# Patient Record
Sex: Female | Born: 1935 | Race: White | Hispanic: No | Marital: Married | Smoking: Never smoker
Health system: Southern US, Community
[De-identification: ages and names within clinical notes are randomized; demographics above are authoritative.]

## PROBLEM LIST (undated history)

## (undated) DIAGNOSIS — E039 Hypothyroidism, unspecified: Secondary | ICD-10-CM

## (undated) DIAGNOSIS — F419 Anxiety disorder, unspecified: Secondary | ICD-10-CM

## (undated) DIAGNOSIS — M1990 Unspecified osteoarthritis, unspecified site: Secondary | ICD-10-CM

## (undated) DIAGNOSIS — R0989 Other specified symptoms and signs involving the circulatory and respiratory systems: Secondary | ICD-10-CM

## (undated) DIAGNOSIS — C801 Malignant (primary) neoplasm, unspecified: Secondary | ICD-10-CM

## (undated) DIAGNOSIS — I10 Essential (primary) hypertension: Secondary | ICD-10-CM

## (undated) DIAGNOSIS — S83289A Other tear of lateral meniscus, current injury, unspecified knee, initial encounter: Secondary | ICD-10-CM

## (undated) HISTORY — PX: OTHER SURGICAL HISTORY: SHX169

## (undated) HISTORY — PX: ABDOMINAL HYSTERECTOMY: SHX81

---

## 2008-10-04 HISTORY — PX: BACK SURGERY: SHX140

## 2012-12-28 ENCOUNTER — Ambulatory Visit: Payer: Medicare Other | Attending: Orthopedic Surgery | Admitting: Physical Therapy

## 2012-12-28 DIAGNOSIS — M25659 Stiffness of unspecified hip, not elsewhere classified: Secondary | ICD-10-CM | POA: Insufficient documentation

## 2012-12-28 DIAGNOSIS — M25559 Pain in unspecified hip: Secondary | ICD-10-CM | POA: Insufficient documentation

## 2012-12-28 DIAGNOSIS — R5381 Other malaise: Secondary | ICD-10-CM | POA: Insufficient documentation

## 2012-12-28 DIAGNOSIS — IMO0001 Reserved for inherently not codable concepts without codable children: Secondary | ICD-10-CM | POA: Insufficient documentation

## 2013-01-01 ENCOUNTER — Ambulatory Visit: Payer: Medicare Other | Admitting: *Deleted

## 2013-01-03 ENCOUNTER — Ambulatory Visit: Payer: Medicare Other | Attending: Orthopedic Surgery | Admitting: Physical Therapy

## 2013-01-03 DIAGNOSIS — R5381 Other malaise: Secondary | ICD-10-CM | POA: Insufficient documentation

## 2013-01-03 DIAGNOSIS — IMO0001 Reserved for inherently not codable concepts without codable children: Secondary | ICD-10-CM | POA: Insufficient documentation

## 2013-01-03 DIAGNOSIS — M25659 Stiffness of unspecified hip, not elsewhere classified: Secondary | ICD-10-CM | POA: Insufficient documentation

## 2013-01-03 DIAGNOSIS — M25559 Pain in unspecified hip: Secondary | ICD-10-CM | POA: Insufficient documentation

## 2013-01-08 ENCOUNTER — Ambulatory Visit: Payer: Medicare Other | Admitting: *Deleted

## 2013-01-10 ENCOUNTER — Ambulatory Visit: Payer: Medicare Other | Admitting: *Deleted

## 2013-01-15 ENCOUNTER — Ambulatory Visit: Payer: Medicare Other | Admitting: Physical Therapy

## 2013-01-17 ENCOUNTER — Ambulatory Visit: Payer: Medicare Other | Admitting: Physical Therapy

## 2013-01-22 ENCOUNTER — Ambulatory Visit: Payer: Medicare Other | Admitting: Physical Therapy

## 2013-01-24 ENCOUNTER — Ambulatory Visit: Payer: Medicare Other | Admitting: Physical Therapy

## 2015-01-16 ENCOUNTER — Ambulatory Visit (INDEPENDENT_AMBULATORY_CARE_PROVIDER_SITE_OTHER): Payer: Medicare Other

## 2015-01-16 ENCOUNTER — Other Ambulatory Visit: Payer: Self-pay | Admitting: Orthopedic Surgery

## 2015-01-16 DIAGNOSIS — M25552 Pain in left hip: Secondary | ICD-10-CM

## 2015-01-16 DIAGNOSIS — R52 Pain, unspecified: Secondary | ICD-10-CM

## 2015-01-16 DIAGNOSIS — M25561 Pain in right knee: Secondary | ICD-10-CM | POA: Diagnosis not present

## 2015-02-11 ENCOUNTER — Ambulatory Visit: Payer: Self-pay | Admitting: Orthopedic Surgery

## 2015-02-11 NOTE — Progress Notes (Signed)
Preoperative surgical orders have been place into the Epic hospital system for Sharon Gordon on 02/11/2015, 11:28 AM  by Mickel Crow for surgery on 03-12-2015.  Preop Knee Scope orders including IV Tylenol and IV Decadron as long as there are no contraindications to the above medications. Arlee Muslim, PA-C

## 2015-03-10 ENCOUNTER — Encounter (HOSPITAL_COMMUNITY): Payer: Self-pay

## 2015-03-10 ENCOUNTER — Encounter (HOSPITAL_COMMUNITY)
Admission: RE | Admit: 2015-03-10 | Discharge: 2015-03-10 | Disposition: A | Discharge status: Home / Self Care | Payer: Medicare Other | Source: Ambulatory Visit | Attending: Orthopedic Surgery | Admitting: Orthopedic Surgery

## 2015-03-10 DIAGNOSIS — I447 Left bundle-branch block, unspecified: Secondary | ICD-10-CM | POA: Diagnosis not present

## 2015-03-10 DIAGNOSIS — M23261 Derangement of other lateral meniscus due to old tear or injury, right knee: Secondary | ICD-10-CM | POA: Diagnosis present

## 2015-03-10 DIAGNOSIS — M1990 Unspecified osteoarthritis, unspecified site: Secondary | ICD-10-CM | POA: Diagnosis not present

## 2015-03-10 DIAGNOSIS — M2241 Chondromalacia patellae, right knee: Secondary | ICD-10-CM | POA: Diagnosis not present

## 2015-03-10 DIAGNOSIS — Z90710 Acquired absence of both cervix and uterus: Secondary | ICD-10-CM | POA: Diagnosis not present

## 2015-03-10 DIAGNOSIS — E039 Hypothyroidism, unspecified: Secondary | ICD-10-CM | POA: Diagnosis not present

## 2015-03-10 DIAGNOSIS — I10 Essential (primary) hypertension: Secondary | ICD-10-CM | POA: Diagnosis not present

## 2015-03-10 DIAGNOSIS — M25561 Pain in right knee: Secondary | ICD-10-CM | POA: Diagnosis not present

## 2015-03-10 HISTORY — DX: Other tear of lateral meniscus, current injury, unspecified knee, initial encounter: S83.289A

## 2015-03-10 HISTORY — DX: Hypothyroidism, unspecified: E03.9

## 2015-03-10 HISTORY — DX: Essential (primary) hypertension: I10

## 2015-03-10 HISTORY — DX: Unspecified osteoarthritis, unspecified site: M19.90

## 2015-03-10 HISTORY — DX: Other specified symptoms and signs involving the circulatory and respiratory systems: R09.89

## 2015-03-10 LAB — BASIC METABOLIC PANEL
Anion gap: 8 (ref 5–15)
BUN: 37 mg/dL — ABNORMAL HIGH (ref 6–20)
CALCIUM: 9.3 mg/dL (ref 8.9–10.3)
CO2: 28 mmol/L (ref 22–32)
Chloride: 104 mmol/L (ref 101–111)
Creatinine, Ser: 1.31 mg/dL — ABNORMAL HIGH (ref 0.44–1.00)
GFR calc non Af Amer: 36 mL/min — ABNORMAL LOW (ref >60)
GFR, EST AFRICAN AMERICAN: 42 mL/min — AB (ref >60)
GLUCOSE: 88 mg/dL (ref 65–99)
POTASSIUM: 4.2 mmol/L (ref 3.5–5.1)
SODIUM: 140 mmol/L (ref 135–145)

## 2015-03-10 LAB — CBC
HCT: 37.1 % (ref 36.0–46.0)
Hemoglobin: 12 g/dL (ref 12.0–15.0)
MCH: 32.3 pg (ref 26.0–34.0)
MCHC: 32.3 g/dL (ref 30.0–36.0)
MCV: 99.7 fL (ref 78.0–100.0)
Platelets: 340 10*3/uL (ref 150–400)
RBC: 3.72 MIL/uL — AB (ref 3.87–5.11)
RDW: 14.3 % (ref 11.5–15.5)
WBC: 6.2 10*3/uL (ref 4.0–10.5)

## 2015-03-10 NOTE — Progress Notes (Signed)
Patient states has already stopped plavix per surgeon instructions

## 2015-03-10 NOTE — Patient Instructions (Addendum)
Sharon Gordon  03/10/2015   Your procedure is scheduled on: Wednesday 03/12/15  Report to [facility] Main  Entrance and follow signs to               [name] at 1:30 PM.  Call this number if you have problems the morning of surgery [phone]   Remember: ONLY 1 PERSON MAY GO WITH YOU TO SHORT STAY TO GET  READY MORNING OF [place].  Do not eat food :After Midnight. Clear liquids from midnight until 09:35 am on 03/12/15 then nothing.      Take these medicines the morning of surgery with A SIP OF WATER: cymbalta, gabapentin, levothyroxine                                You may not have any metal on your body including hair pins and              piercings  Do not wear jewelry, make-up, lotions, powders or perfumes, deodorant             Do not wear nail polish.  Do not shave  48 hours prior to surgery.              Men may shave face and neck.  Do not bring valuables to the hospital. [place].  Contacts, dentures or bridgework may not be worn into surgery.   Patients discharged the day of surgery will not be allowed to drive home.  Name and phone number of your driver: Daughter Norvel Richards cell [phone]   _____________________________________________________________________           [facility] - Preparing for Surgery Before surgery, you can play an important role.  Because skin is not sterile, your skin needs to be as free of germs as possible.  You can reduce the number of germs on your skin by washing with CHG (chlorahexidine gluconate) soap before surgery.  CHG is an antiseptic cleaner which kills germs and bonds with the skin to continue killing germs even after washing. Please DO NOT use if you have an allergy to CHG or antibacterial soaps.  If your skin becomes reddened/irritated stop using the CHG and inform your nurse when you arrive at Short Stay. Do not shave (including legs and  underarms) for at least 48 hours prior to the first CHG shower.  You may shave your face/neck. Please follow these instructions carefully:  1.  Shower with CHG Soap the night before surgery and the  morning of Surgery.  2.  If you choose to wash your hair, wash your hair first as usual with your  normal  shampoo.  3.  After you shampoo, rinse your hair and body thoroughly to remove the  shampoo.                            4.  Use CHG as you would any other liquid soap.  You can apply chg directly  to the skin and wash                       Gently with a scrungie or clean washcloth.  5.  Apply the CHG Soap to your body ONLY FROM THE NECK DOWN.   Do not use on face/ open                           Wound or open sores. Avoid contact with eyes, ears mouth and genitals (private parts).                       Wash face,  Genitals (private parts) with your normal soap.             6.  Wash thoroughly, paying special attention to the area where your surgery  will be performed.  7.  Thoroughly rinse your body with warm water from the neck down.  8.  DO NOT shower/wash with your normal soap after using and rinsing off  the CHG Soap.                9.  Pat yourself dry with a clean towel.            10.  Wear clean pajamas.            11.  Place clean sheets on your bed the night of your first shower and do not  sleep with pets. Day of Surgery : Do not apply any lotions/deodorants the morning of surgery.  Please wear clean clothes to the hospital/surgery center.  FAILURE TO FOLLOW THESE INSTRUCTIONS MAY RESULT IN THE CANCELLATION OF YOUR SURGERY PATIENT SIGNATURE_________________________________  NURSE SIGNATURE__________________________________  ________________________________________________________________________    CLEAR LIQUID DIET   Foods Allowed                                                                     Foods Excluded  Coffee and tea, regular and decaf                              liquids that you cannot  Plain Jell-O in any flavor                                             see through such as: Fruit ices (not with fruit pulp)                                     milk, soups, orange juice  Iced Popsicles                                    All solid food Carbonated beverages, regular and diet                                    Cranberry, grape and apple juices Sports drinks like Gatorade Lightly seasoned clear broth or consume(fat free) Sugar, honey syrup  Sample Menu Breakfast  Lunch                                     Supper Cranberry juice                    Beef broth                            Chicken broth Jell-O                                     Grape juice                           Apple juice Coffee or tea                        Jell-O                                      Popsicle                                                Coffee or tea                        Coffee or tea  _____________________________________________________________________    Incentive Spirometer  An incentive spirometer is a tool that can help keep your lungs clear and active. This tool measures how well you are filling your lungs with each breath. Taking long deep breaths may help reverse or decrease the chance of developing breathing (pulmonary) problems (especially infection) following:  A long period of time when you are unable to move or be active. BEFORE THE PROCEDURE   If the spirometer includes an indicator to show your best effort, your nurse or respiratory therapist will set it to a desired goal.  If possible, sit up straight or lean slightly forward. Try not to slouch.  Hold the incentive spirometer in an upright position. INSTRUCTIONS FOR USE  1. Sit on the edge of your bed if possible, or sit up as far as you can in bed or on a chair. 2. Hold the incentive spirometer in an upright position. 3. Breathe out normally. 4. Place the  mouthpiece in your mouth and seal your lips tightly around it. 5. Breathe in slowly and as deeply as possible, raising the piston or the ball toward the top of the column. 6. Hold your breath for 3-5 seconds or for as long as possible. Allow the piston or ball to fall to the bottom of the column. 7. Remove the mouthpiece from your mouth and breathe out normally. 8. Rest for a few seconds and repeat Steps 1 through 7 at least 10 times every 1-2 hours when you are awake. Take your time and take a few normal breaths between deep breaths. 9. The spirometer may include an indicator to show your best effort. Use the indicator as a goal to work toward during each repetition. 10. After each set of 10 deep breaths,  practice coughing to be sure your lungs are clear. If you have an incision (the cut made at the time of surgery), support your incision when coughing by placing a pillow or rolled up towels firmly against it. Once you are able to get out of bed, walk around indoors and cough well. You may stop using the incentive spirometer when instructed by your caregiver.  RISKS AND COMPLICATIONS  Take your time so you do not get dizzy or light-headed.  If you are in pain, you may need to take or ask for pain medication before doing incentive spirometry. It is harder to take a deep breath if you are having pain. AFTER USE  Rest and breathe slowly and easily.  It can be helpful to keep track of a log of your progress. Your caregiver can provide you with a simple table to help with this. If you are using the spirometer at home, follow these instructions: [place] IF:   You are having difficultly using the spirometer.  You have trouble using the spirometer as often as instructed.  Your pain medication is not giving enough relief while using the spirometer.  You develop fever of 100.5 F (38.1 C) or higher. SEEK IMMEDIATE MEDICAL CARE IF:   You cough up bloody sputum that had not been present  before.  You develop fever of 102 F (38.9 C) or greater.  You develop worsening pain at or near the incision site. MAKE SURE YOU:   Understand these instructions.  Will watch your condition.  Will get help right away if you are not doing well or get worse. Document Released: 01/31/2007 Document Revised: 12/13/2011 Document Reviewed: 04/03/2007 [facility] Patient Information 2014 [place], [place].   ________________________________________________________________________

## 2015-03-10 NOTE — Progress Notes (Signed)
bmet results faxed to dr Wynelle Link by epic

## 2015-03-12 ENCOUNTER — Ambulatory Visit (HOSPITAL_COMMUNITY): Payer: Medicare Other | Admitting: Anesthesiology

## 2015-03-12 ENCOUNTER — Ambulatory Visit (HOSPITAL_COMMUNITY)
Admission: RE | Admit: 2015-03-12 | Discharge: 2015-03-12 | Disposition: A | Discharge status: Home / Self Care | Payer: Medicare Other | Source: Ambulatory Visit | Attending: Orthopedic Surgery | Admitting: Orthopedic Surgery

## 2015-03-12 ENCOUNTER — Encounter (HOSPITAL_COMMUNITY): Payer: Self-pay | Admitting: *Deleted

## 2015-03-12 ENCOUNTER — Encounter (HOSPITAL_COMMUNITY)
Admission: RE | Disposition: A | Discharge status: Home / Self Care | Payer: Self-pay | Source: Ambulatory Visit | Attending: Orthopedic Surgery

## 2015-03-12 DIAGNOSIS — S83289A Other tear of lateral meniscus, current injury, unspecified knee, initial encounter: Secondary | ICD-10-CM | POA: Diagnosis present

## 2015-03-12 DIAGNOSIS — M25561 Pain in right knee: Secondary | ICD-10-CM | POA: Insufficient documentation

## 2015-03-12 DIAGNOSIS — M23261 Derangement of other lateral meniscus due to old tear or injury, right knee: Secondary | ICD-10-CM | POA: Insufficient documentation

## 2015-03-12 DIAGNOSIS — E039 Hypothyroidism, unspecified: Secondary | ICD-10-CM | POA: Insufficient documentation

## 2015-03-12 DIAGNOSIS — I10 Essential (primary) hypertension: Secondary | ICD-10-CM | POA: Diagnosis not present

## 2015-03-12 DIAGNOSIS — M1990 Unspecified osteoarthritis, unspecified site: Secondary | ICD-10-CM | POA: Insufficient documentation

## 2015-03-12 DIAGNOSIS — Z90710 Acquired absence of both cervix and uterus: Secondary | ICD-10-CM | POA: Insufficient documentation

## 2015-03-12 DIAGNOSIS — M2241 Chondromalacia patellae, right knee: Secondary | ICD-10-CM | POA: Insufficient documentation

## 2015-03-12 DIAGNOSIS — I447 Left bundle-branch block, unspecified: Secondary | ICD-10-CM | POA: Insufficient documentation

## 2015-03-12 HISTORY — PX: KNEE ARTHROSCOPY: SHX127

## 2015-03-12 SURGERY — ARTHROSCOPY, KNEE
Anesthesia: General | Site: Knee | Laterality: Right

## 2015-03-12 MED ORDER — LACTATED RINGERS IR SOLN
Status: DC | PRN
  Administered 2015-03-12: 18000 mL

## 2015-03-12 MED ORDER — CEFAZOLIN SODIUM-DEXTROSE 2-3 GM-% IV SOLR
2.0000 g | INTRAVENOUS | Status: AC
  Administered 2015-03-12: 2 g via INTRAVENOUS

## 2015-03-12 MED ORDER — FENTANYL CITRATE (PF) 100 MCG/2ML IJ SOLN
INTRAMUSCULAR | Status: AC
  Filled 2015-03-12: qty 2

## 2015-03-12 MED ORDER — HYDROCODONE-ACETAMINOPHEN 5-325 MG PO TABS
1.0000 | ORAL_TABLET | ORAL | Status: DC | PRN
  Administered 2015-03-12: 1 via ORAL
  Filled 2015-03-12: qty 1

## 2015-03-12 MED ORDER — FENTANYL CITRATE (PF) 100 MCG/2ML IJ SOLN
INTRAMUSCULAR | Status: DC | PRN
  Administered 2015-03-12: 50 ug via INTRAVENOUS
  Administered 2015-03-12 (×2): 25 ug via INTRAVENOUS

## 2015-03-12 MED ORDER — HYDROCODONE-ACETAMINOPHEN 5-325 MG PO TABS
1.0000 | ORAL_TABLET | Freq: Four times a day (QID) | ORAL | Status: DC | PRN
Start: 2015-03-12 — End: 2015-07-16

## 2015-03-12 MED ORDER — ONDANSETRON HCL 4 MG/2ML IJ SOLN
INTRAMUSCULAR | Status: DC | PRN
  Administered 2015-03-12: 4 mg via INTRAVENOUS

## 2015-03-12 MED ORDER — LACTATED RINGERS IV SOLN
INTRAVENOUS | Status: DC
  Administered 2015-03-12: 1000 mL via INTRAVENOUS

## 2015-03-12 MED ORDER — ONDANSETRON HCL 4 MG/2ML IJ SOLN
INTRAMUSCULAR | Status: AC
  Filled 2015-03-12: qty 2

## 2015-03-12 MED ORDER — FENTANYL CITRATE (PF) 100 MCG/2ML IJ SOLN
25.0000 ug | INTRAMUSCULAR | Status: DC | PRN
  Administered 2015-03-12: 50 ug via INTRAVENOUS

## 2015-03-12 MED ORDER — CEFAZOLIN SODIUM-DEXTROSE 2-3 GM-% IV SOLR
INTRAVENOUS | Status: AC
  Filled 2015-03-12: qty 50

## 2015-03-12 MED ORDER — LIDOCAINE HCL (CARDIAC) 20 MG/ML IV SOLN
INTRAVENOUS | Status: DC | PRN
  Administered 2015-03-12: 50 mg via INTRAVENOUS

## 2015-03-12 MED ORDER — ACETAMINOPHEN 10 MG/ML IV SOLN
1000.0000 mg | Freq: Once | INTRAVENOUS | Status: AC
  Administered 2015-03-12: 1000 mg via INTRAVENOUS
  Filled 2015-03-12: qty 100

## 2015-03-12 MED ORDER — PROPOFOL 10 MG/ML IV BOLUS
INTRAVENOUS | Status: DC | PRN
  Administered 2015-03-12: 100 mg via INTRAVENOUS

## 2015-03-12 MED ORDER — ACETAMINOPHEN 10 MG/ML IV SOLN
INTRAVENOUS | Status: AC
  Filled 2015-03-12: qty 100

## 2015-03-12 MED ORDER — METHOCARBAMOL 500 MG PO TABS
500.0000 mg | ORAL_TABLET | Freq: Four times a day (QID) | ORAL | Status: DC | PRN
  Administered 2015-03-12: 500 mg via ORAL
  Filled 2015-03-12: qty 1

## 2015-03-12 MED ORDER — PROPOFOL 10 MG/ML IV BOLUS
INTRAVENOUS | Status: AC
  Filled 2015-03-12: qty 20

## 2015-03-12 MED ORDER — LIDOCAINE HCL (CARDIAC) 20 MG/ML IV SOLN
INTRAVENOUS | Status: AC
  Filled 2015-03-12: qty 5

## 2015-03-12 MED ORDER — BUPIVACAINE-EPINEPHRINE (PF) 0.25% -1:200000 IJ SOLN
INTRAMUSCULAR | Status: AC
  Filled 2015-03-12: qty 30

## 2015-03-12 MED ORDER — METHOCARBAMOL 500 MG PO TABS: 500.0000 mg | ORAL_TABLET | Freq: Four times a day (QID) | ORAL | Status: DC

## 2015-03-12 SURGICAL SUPPLY — 28 items
BANDAGE ELASTIC 6 VELCRO ST LF (GAUZE/BANDAGES/DRESSINGS) ×2 IMPLANT
BLADE 4.2CUDA (BLADE) ×2 IMPLANT
BNDG CONFORM 6X.82 1P STRL (GAUZE/BANDAGES/DRESSINGS) ×2 IMPLANT
COVER SURGICAL LIGHT HANDLE (MISCELLANEOUS) ×2 IMPLANT
CUFF TOURN SGL QUICK 34 (TOURNIQUET CUFF) ×1
CUFF TRNQT CYL 34X4X40X1 (TOURNIQUET CUFF) ×1 IMPLANT
DRAPE U-SHAPE 47X51 STRL (DRAPES) ×2 IMPLANT
DRSG EMULSION OIL 3X3 NADH (GAUZE/BANDAGES/DRESSINGS) ×2 IMPLANT
DRSG PAD ABDOMINAL 8X10 ST (GAUZE/BANDAGES/DRESSINGS) ×2 IMPLANT
DURAPREP 26ML APPLICATOR (WOUND CARE) ×2 IMPLANT
GAUZE SPONGE 4X4 12PLY STRL (GAUZE/BANDAGES/DRESSINGS) ×2 IMPLANT
GLOVE BIO SURGEON STRL SZ8 (GLOVE) ×2 IMPLANT
GLOVE BIOGEL PI IND STRL 8 (GLOVE) ×1 IMPLANT
GLOVE BIOGEL PI INDICATOR 8 (GLOVE) ×1
GOWN STRL REUS W/TWL LRG LVL3 (GOWN DISPOSABLE) ×2 IMPLANT
KIT BASIN OR (CUSTOM PROCEDURE TRAY) ×2 IMPLANT
MANIFOLD NEPTUNE II (INSTRUMENTS) ×2 IMPLANT
PACK ARTHROSCOPY WL (CUSTOM PROCEDURE TRAY) ×2 IMPLANT
PACK ICE MAXI GEL EZY WRAP (MISCELLANEOUS) ×6 IMPLANT
PADDING CAST COTTON 6X4 STRL (CAST SUPPLIES) ×4 IMPLANT
PEN SKIN MARKING BROAD (MISCELLANEOUS) ×2 IMPLANT
POSITIONER SURGICAL ARM (MISCELLANEOUS) ×2 IMPLANT
SET ARTHROSCOPY TUBING (MISCELLANEOUS) ×1
SET ARTHROSCOPY TUBING LN (MISCELLANEOUS) ×1 IMPLANT
SUT ETHILON 4 0 PS 2 18 (SUTURE) ×2 IMPLANT
TOWEL OR 17X26 10 PK STRL BLUE (TOWEL DISPOSABLE) ×2 IMPLANT
WAND 90 DEG TURBOVAC W/CORD (SURGICAL WAND) ×2 IMPLANT
WRAP KNEE MAXI GEL POST OP (GAUZE/BANDAGES/DRESSINGS) ×2 IMPLANT

## 2015-03-12 NOTE — Anesthesia Postprocedure Evaluation (Signed)
  Anesthesia Post-op Note  Patient: Sharon Gordon  Procedure(s) Performed: Procedure(s) (LRB): ARTHROSCOPY RIGHT KNEE WITH LATERAL MENISCAL DEBRIDEMENT (Right)  Patient Location: PACU  Anesthesia Type: General  Level of Consciousness: awake and alert   Airway and Oxygen Therapy: Patient Spontanous Breathing  Post-op Pain: mild  Post-op Assessment: Post-op Vital signs reviewed, Patient's Cardiovascular Status Stable, Respiratory Function Stable, Patent Airway and No signs of Nausea or vomiting  Last Vitals:  Filed Vitals:   03/12/15 1711  BP: 141/80  Pulse: 74  Temp: 36.3 C  Resp: 12    Post-op Vital Signs: stable   Complications: No apparent anesthesia complications

## 2015-03-12 NOTE — Transfer of Care (Signed)
Immediate Anesthesia Transfer of Care Note  Patient: Sharon Gordon  Procedure(s) Performed: Procedure(s): ARTHROSCOPY RIGHT KNEE WITH LATERAL MENISCAL DEBRIDEMENT (Right)  Patient Location: PACU  Anesthesia Type:General  Level of Consciousness: awake, alert  and oriented  Airway & Oxygen Therapy: Patient Spontanous Breathing and Patient connected to face mask oxygen  Post-op Assessment: Report given to RN and Post -op Vital signs reviewed and stable  Post vital signs: Reviewed and stable  Last Vitals:  Filed Vitals:   03/12/15 1307  BP: 151/85  Pulse: 85  Temp: 36.3 C  Resp: 20    Complications: No apparent anesthesia complications

## 2015-03-12 NOTE — H&P (Signed)
  CC- Sharon Gordon is a 81 y.o. female who presents with right knee pain.  HPI- . Knee Pain: Patient presents with knee pain involving the  right knee. Onset of the symptoms was several months ago. Inciting event: none known. Current symptoms include giving out, pain located laterally and stiffness. Pain is aggravated by lateral movements, pivoting, rising after sitting and walking.  Patient has had no prior knee problems. Evaluation to date: MRI: abnormal lateral meniscal tear. Treatment to date: rest.  Past Medical History  Diagnosis Date  . Hypertension   . Hypothyroidism   . Arthritis   . Lateral meniscal tear     right  . Vein symptom     patients reports vein behind left leg at knee hurting at times for last week    Past Surgical History  Procedure Laterality Date  . Bladder tach  1980's  . Abdominal hysterectomy  1980's    complete  . Colonscopy      Prior to Admission medications   Medication Sig Start Date End Date Taking? Authorizing Provider  cholecalciferol (VITAMIN D) 1000 UNITS tablet Take 1,000 Units by mouth daily.   Yes Historical Provider, MD  diclofenac (VOLTAREN) 75 MG EC tablet Take 75 mg by mouth 2 (two) times daily.   Yes Historical Provider, MD  DULoxetine (CYMBALTA) 30 MG capsule Take 30 mg by mouth daily.   Yes Historical Provider, MD  gabapentin (NEURONTIN) 300 MG capsule Take 600 mg by mouth 3 (three) times daily.   Yes Historical Provider, MD  levothyroxine (SYNTHROID, LEVOTHROID) 50 MCG tablet Take 50 mcg by mouth daily before breakfast.   Yes Historical Provider, MD  lisinopril-hydrochlorothiazide (PRINZIDE,ZESTORETIC) 20-12.5 MG per tablet Take 1 tablet by mouth 2 (two) times daily.   Yes Historical Provider, MD  Multiple Vitamin (MULTIVITAMIN WITH MINERALS) TABS tablet Take 1 tablet by mouth daily.   Yes Historical Provider, MD  Omega-3 Fatty Acids (FISH OIL) 600 MG CAPS Take 1 capsule by mouth 2 (two) times daily.   Yes Historical Provider, MD   traMADol (ULTRAM) 50 MG tablet Take 1 tablet by mouth every 6 (six) hours as needed for moderate pain.  02/26/15  Yes Historical Provider, MD  vitamin E 400 UNIT capsule Take 400 Units by mouth daily.   Yes Historical Provider, MD  amLODipine (NORVASC) 5 MG tablet Take 5 mg by mouth every evening.    Historical Provider, MD   KNEE EXAM antalgic gait, soft tissue tenderness over lateral joint line, no effusion, negative drawer sign, collateral ligaments intact  Physical Examination: General appearance - alert, well appearing, and in no distress Mental status - alert, oriented to person, place, and time Chest - clear to auscultation, no wheezes, rales or rhonchi, symmetric air entry Heart - normal rate, regular rhythm, normal S1, S2, no murmurs, rubs, clicks or gallops Abdomen - soft, nontender, nondistended, no masses or organomegaly Neurological - alert, oriented, normal speech, no focal findings or movement disorder noted   Asessment/Plan--- Right knee lateral meniscal tear- - Plan right knee arthroscopy with meniscal debridement. Procedure risks and potential comps discussed with patient who elects to proceed. Goals are decreased pain and increased function with a high likelihood of achieving both

## 2015-03-12 NOTE — Discharge Instructions (Signed)
Dr. Gaynelle Arabian Total Joint Specialist [facility] [address], [place], [place] [address] [phone]   Arthroscopic Procedure, Knee An arthroscopic proce dure can find what is wrong with your knee. PROCEDURE Arthroscopy is a surgical technique that allows your orthopedic surgeon to diagnose and treat your knee injury with accuracy. They will look into your knee through a small instrument. This is almost like a small (pencil sized) telescope. Because arthroscopy affects your knee less than open knee surgery, you can anticipate a more rapid recovery. Taking an active role by following your caregiver's instructions will help with rapid and complete recovery. Use crutches, rest, elevation, ice, and knee exercises as instructed. The length of recovery depends on various factors including type of injury, age, physical condition, medical conditions, and your rehabilitation. Your knee is the joint between the large bones (femur and tibia) in your leg. Cartilage covers these bone ends which are smooth and slippery and allow your knee to bend and move smoothly. Two menisci, thick, semi-lunar shaped pads of cartilage which form a rim inside the joint, help absorb shock and stabilize your knee. Ligaments bind the bones together and support your knee joint. Muscles move the joint, help support your knee, and take stress off the joint itself. Because of this all programs and physical therapy to rehabilitate an injured or repaired knee require rebuilding and strengthening your muscles. AFTER THE PROCEDURE  After the procedure, you will be moved to a recovery area until most of the effects of the medication have worn off. Your caregiver will discuss the test results with you.   Only take over-the-counter or prescription medicines for pain, discomfort, or fever as directed by your caregiver.  SEEK MEDICAL CARE IF:   You have increased bleeding from your wounds.   You see  redness, swelling, or have increasing pain in your wounds.   You have pus coming from your wound.   You have an oral temperature above 102 F (38.9 C).   You notice a bad smell coming from the wound or dressing.   You have severe pain with any motion of your knee.  SEEK IMMEDIATE MEDICAL CARE IF:   You develop a rash.   You have difficulty breathing.   You have any allergic problems.  FURTHER INSTRUCTIONS:   ICE to the affected knee every three hours for 30 minutes at a time and then as needed for pain and swelling.  Continue to use ice on the knee for pain and swelling from surgery. You may notice swelling that will progress down to the foot and ankle.  This is normal after surgery.  Elevate the leg when you are not up walking on it.    DIET You may resume your previous home diet once your are discharged from the hospital.  DRESSING / WOUND CARE / SHOWERING  You may start showering two days after being discharged home but do not submerge the incisions under water.  Change dressing 48 hours after the procedure and then cover the small incisions with band aids until your follow up visit. Change the surgical dressings daily and reapply a dry dressing each time.   ACTIVITY Walk with your walker as instructed. Use walker as long as suggested by your caregivers. Avoid periods of inactivity such as sitting longer than an hour when not asleep. This helps prevent blood clots.  You may resume a sexual relationship in one month or when given the OK by your doctor.  You may  return to work once you are cleared by your doctor.  Do not drive a car for 6 weeks or until released by you surgeon.  Do not drive while taking narcotics.  WEIGHT BEARING You may bear weight as tolerated on your right leg. Use the walker for balance and comfort until you feel safe and comfortable without it.  POSTOPERATIVE CONSTIPATION PROTOCOL Constipation - defined medically as fewer than three stools per week  and severe constipation as less than one stool per week.  One of the most common issues patients have following surgery is constipation.  Even if you have a regular bowel pattern at home, your normal regimen is likely to be disrupted due to multiple reasons following surgery.  Combination of anesthesia, postoperative narcotics, change in appetite and fluid intake all can affect your bowels.  In order to avoid complications following surgery, here are some recommendations in order to help you during your recovery period.  Colace (docusate) - Pick up an over-the-counter form of Colace or another stool softener and take twice a day as long as you are requiring postoperative pain medications.  Take with a full glass of water daily.  If you experience loose stools or diarrhea, hold the colace until you stool forms back up.  If your symptoms do not get better within 1 week or if they get worse, check with your doctor.  Dulcolax (bisacodyl) - Pick up over-the-counter and take as directed by the product packaging as needed to assist with the movement of your bowels.  Take with a full glass of water.  Use this product as needed if not relieved by Colace only.   MiraLax (polyethylene glycol) - Pick up over-the-counter to have on hand.  MiraLax is a solution that will increase the amount of water in your bowels to assist with bowel movements.  Take as directed and can mix with a glass of water, juice, soda, coffee, or tea.  Take if you go more than two days without a movement. Do not use MiraLax more than once per day. Call your doctor if you are still constipated or irregular after using this medication for 7 days in a row.  If you continue to have problems with postoperative constipation, please contact the office for further assistance and recommendations.  If you experience "the worst abdominal pain ever" or develop nausea or vomiting, please contact the office immediatly for further recommendations for  treatment.  ITCHING  If you experience itching with your medications, try taking only a single pain pill, or even half a pain pill at a time.  You can also use Benadryl over the counter for itching or also to help with sleep.   TED HOSE STOCKINGS Wear the elastic stockings on both legs for three weeks following surgery during the day but you may remove then at night for sleeping.  MEDICATIONS See your medication summary on the After Visit Summary that the nursing staff will review with you prior to discharge.  You may have some home medications which will be placed on hold until you complete the course of blood thinner medication.  It is important for you to complete the blood thinner medication as prescribed by your surgeon.  Continue your approved medications as instructed at time of discharge. Do not drive while taking narcotics.   PRECAUTIONS If you experience chest pain or shortness of breath - call 911 immediately for transfer to the hospital emergency department.  If you develop a fever greater that 101 F,  purulent drainage from wound, increased redness or drainage from wound, foul odor from the wound/dressing, or calf pain - CONTACT YOUR SURGEON.                                                   FOLLOW-UP APPOINTMENTS Make sure you keep all of your appointments after your operation with your surgeon and caregivers. You should call the office at [phone]  and make an appointment for approximately one week after the date of your surgery or on the date instructed by your surgeon outlined in the "After Visit Summary".  RANGE OF MOTION AND STRENGTHENING EXERCISES  Rehabilitation of the knee is important following a knee injury or an operation. After just a few days of immobilization, the muscles of the thigh which control the knee become weakened and shrink (atrophy). Knee exercises are designed to build up the tone and strength of the thigh muscles and to improve knee motion. Often  times heat used for twenty to thirty minutes before working out will loosen up your tissues and help with improving the range of motion but do not use heat for the first two weeks following surgery. These exercises can be done on a training (exercise) mat, on the floor, on a table or on a bed. Use what ever works the best and is most comfortable for you Knee exercises include:  QUAD STRENGTHENING EXERCISES Strengthening Quadriceps Sets  Tighten muscles on top of thigh by pushing knees down into floor or table. Hold for 20 seconds. Repeat 10 times. Do 2 sessions per day.     Strengthening Terminal Knee Extension  With knee bent over bolster, straighten knee by tightening muscle on top of thigh. Be sure to keep bottom of knee on bolster. Hold for 20 seconds. Repeat 10 times. Do 2 sessions per day.   Straight Leg with Bent Knee  Lie on back with opposite leg bent. Keep involved knee slightly bent at knee and raise leg 4-6". Hold for 10 seconds. Repeat 20 times per set. Do 2 sets per session. Do 2 sessions per day.      General Anesthesia, Care After Refer to this sheet in the next few weeks. These instructions provide you with information on caring for yourself after your procedure. Your health care provider may also give you more specific instructions. Your treatment has been planned according to current medical practices, but problems sometimes occur. Call your health care provider if you have any problems or questions after your procedure. WHAT TO EXPECT AFTER THE PROCEDURE After the procedure, it is typical to experience:  Sleepiness.  Nausea and vomiting. HOME CARE INSTRUCTIONS  For the first 24 hours after general anesthesia:  Have a responsible person with you.  Do not drive a car. If you are alone, do not take public transportation.  Do not drink alcohol.  Do not take medicine that has not been prescribed by your health care provider.  Do not sign important  papers or make important decisions.  You may resume a normal diet and activities as directed by your health care provider.  Change bandages (dressings) as directed.  If you have questions or problems that seem related to general anesthesia, call the hospital and ask for the anesthetist or anesthesiologist on call. SEEK MEDICAL CARE IF:  You have nausea and vomiting that continue  the day after anesthesia.  You develop a rash. SEEK IMMEDIATE MEDICAL CARE IF:   You have difficulty breathing.  You have chest pain.  You have any allergic problems. Document Released: 12/27/2000 Document Revised: 09/25/2013 Document Reviewed: 04/05/2013 [facility] Patient Information 2015 [place], [place]. This information is not intended to replace advice given to you by your health care provider. Make sure you discuss any questions you have with your health care provider.

## 2015-03-12 NOTE — Op Note (Signed)
Preoperative diagnosis-  Right knee lateral meniscal tear  Postoperative diagnosis Right- knee lateral meniscal tear  Procedure- Right knee arthroscopy with lateral  meniscal debridement    Surgeon- Dione Plover. Glenette Bookwalter, MD  Anesthesia-General  EBL-  Minimal  Complications- None  Condition- PACU - hemodynamically stable.  Brief clinical note- -Sharon Gordon is a 81 y.o.  female with a several month history of right knee pain and mechanical symptoms. Exam and history suggested lateral meniscal tear confirmed by MRI. The patient presents now for arthroscopy and debridement  Procedure in detail -       After successful administration of General anesthetic, a tourmiquet is placed high on the Left  thigh and the Left lower extremity is prepped and draped in the usual sterile fashion. Time out is performed by the surgical team. Standard superomedial and inferolateral portal sites are marked and incisions made with an 11 blade. The inflow cannula is passed through the superomedial portal and camera through the inferolateral portal and inflow is initiated. Arthroscopic visualization proceeds.      The undersurface of the patella and trochlea are visualized and there is minimal chondromalacia. The medial and lateral gutters are visualized and there are   no loose bodies. Flexion and valgus force is applied to the knee and the medial compartment is entered. A spinal needle is passed into the joint through the site marked for the inferomedial portal. A small incision is made and the dilator passed into the joint. The findings for the medial compartment are normal .     The intercondylar notch is visualized and the ACL appears normal. The lateral compartment is entered and the findings are bucket handle tear of the lateral meniscus which is unstable and grade II/III chondral changes lateral tibial plateau . The tear is debrided to a stable base with baskets and a shaver and sealed off with the Arthrocare. I  needed to perform almost a subtotal meniscectomy to get back to stable meniscal remnant. It is probed and found to be stable. There were no unstable chondral defects.     The joint is again inspected and there are no other tears, defects or loose bodies identified. The arthroscopic equipment is then removed from the inferior portals which are closed with interrupted 4-0 nylon. 20 ml of .25% Marcaine with epinephrine are injected through the inflow cannula and the cannula is then removed and the portal closed with nylon. The incisions are cleaned and dried and a bulky sterile dressing is applied. The patient is then awakened and transported to recovery in stable condition.   03/12/2015, 4:13 PM

## 2015-03-12 NOTE — Anesthesia Preprocedure Evaluation (Addendum)
Anesthesia Evaluation  Patient identified by MRN, date of birth, ID band Patient awake    Reviewed: Allergy & Precautions, H&P , NPO status , Patient's Chart, lab work & pertinent test results  Airway Mallampati: II  TM Distance: >3 FB Neck ROM: full    Dental no notable dental hx. (+) Teeth Intact, Dental Advisory Given   Pulmonary neg pulmonary ROS,  breath sounds clear to auscultation  Pulmonary exam normal       Cardiovascular Exercise Tolerance: Good hypertension, Pt. on medications Normal cardiovascular examRhythm:regular Rate:Normal  LBBB   Neuro/Psych negative neurological ROS  negative psych ROS   GI/Hepatic negative GI ROS, Neg liver ROS,   Endo/Other  negative endocrine ROSHypothyroidism   Renal/GU negative Renal ROS  negative genitourinary   Musculoskeletal   Abdominal   Peds  Hematology negative hematology ROS (+)   Anesthesia Other Findings   Reproductive/Obstetrics negative OB ROS                            Anesthesia Physical Anesthesia Plan  ASA: III  Anesthesia Plan: General   Post-op Pain Management:    Induction: Intravenous  Airway Management Planned: LMA  Additional Equipment:   Intra-op Plan:   Post-operative Plan:   Informed Consent: I have reviewed the patients History and Physical, chart, labs and discussed the procedure including the risks, benefits and alternatives for the proposed anesthesia with the patient or authorized representative who has indicated his/her understanding and acceptance.   Dental Advisory Given  Plan Discussed with: CRNA and Surgeon  Anesthesia Plan Comments:         Anesthesia Quick Evaluation

## 2015-03-12 NOTE — Addendum Note (Signed)
Addendum  created 03/12/15 1810 by Rod Mae, MD   Modules edited: Orders

## 2015-03-12 NOTE — Anesthesia Procedure Notes (Signed)
Procedure Name: LMA Insertion Date/Time: 03/12/2015 3:19 PM Performed by: Glory Buff Pre-anesthesia Checklist: Patient identified, Emergency Drugs available, Suction available, Patient being monitored and Timeout performed Patient Re-evaluated:Patient Re-evaluated prior to inductionOxygen Delivery Method: Circle system utilized Preoxygenation: Pre-oxygenation with 100% oxygen Intubation Type: IV induction LMA: LMA inserted LMA Size: 4.0 Number of attempts: 1 Placement Confirmation: positive ETCO2 Tube secured with: Tape

## 2015-03-12 NOTE — Interval H&P Note (Signed)
History and Physical Interval Note:  03/12/2015 3:07 PM  Sharon Gordon  has presented today for surgery, with the diagnosis of right lateral meniscal tear  The various methods of treatment have been discussed with the patient and family. After consideration of risks, benefits and other options for treatment, the patient has consented to  Procedure(s): ARTHROSCOPY RIGHT KNEE WITH DEBRIDEMENT (Right) as a surgical intervention .  The patient's history has been reviewed, patient examined, no change in status, stable for surgery.  I have reviewed the patient's chart and labs.  Questions were answered to the patient's satisfaction.     Gearlean Alf

## 2015-03-13 ENCOUNTER — Encounter (HOSPITAL_COMMUNITY): Payer: Self-pay | Admitting: Orthopedic Surgery

## 2015-06-25 NOTE — Progress Notes (Signed)
Please put orders in Epic surgery 07-14-15 pre op 07-09-15 Thanks

## 2015-07-01 ENCOUNTER — Ambulatory Visit: Payer: Self-pay | Admitting: Orthopedic Surgery

## 2015-07-01 NOTE — Progress Notes (Signed)
Preoperative surgical orders have been place into the Epic hospital system for Sharon Gordon on 07/01/2015, 9:36 AM  by Mickel Crow for surgery on 07-14-2015.  Preop Total Knee orders including Experal, IV Tylenol, and IV Decadron as long as there are no contraindications to the above medications. Arlee Muslim, PA-C

## 2015-07-07 NOTE — Patient Instructions (Signed)
Sharon Gordon  07/07/2015   Your procedure is scheduled on: Monday 07/14/2015  Report to [facility]  Entrance take [facility]  elevators to 3rd floor to  [place] at 110 PM.  Call this number if you have problems the morning of surgery [phone]   Remember: ONLY 1 PERSON MAY GO WITH YOU TO SHORT STAY TO GET  READY MORNING OF [place].   Do not eat food  :After Midnight.  MAY HAVE CLEAR LIQUIDS FROM MIDNIGHT UP UNTIL 1010 AM THEN NOTHING UNTIL AFTER SURGERY!     Take these medicines the morning of surgery with A SIP OF WATER: CYMBALTA, LEVOTHYROXINE, GABAPENTIN  DO NOT TAKE ANY DIABETIC MEDICATIONS DAY OF YOUR SURGERY                               You may not have any metal on your body including hair pins and              piercings  Do not wear jewelry, make-up, lotions, powders or perfumes, deodorant             Do not wear nail polish.  Do not shave  48 hours prior to surgery.              Men may shave face and neck.   Do not bring valuables to the hospital. [place].  Contacts, dentures or bridgework may not be worn into surgery.  Leave suitcase in the car. After surgery it may be brought to your room.     Patients discharged the day of surgery will not be allowed to drive home.  Name and phone number of your driver:  Special Instructions: N/A              Please read over the following fact sheets you were given: _____________________________________________________________________                CLEAR LIQUID DIET   Foods Allowed                                                                     Foods Excluded  Coffee and tea, regular and decaf                             liquids that you cannot  Plain Jell-O in any flavor                                             see through such as: Fruit ices (not with fruit pulp)                                     milk, soups, orange  juice  Iced Popsicles  All solid food Carbonated beverages, regular and diet                                    Cranberry, grape and apple juices Sports drinks like Gatorade Lightly seasoned clear broth or consume(fat free) Sugar, honey syrup  Sample Menu Breakfast                                Lunch                                     Supper Cranberry juice                    Beef broth                            Chicken broth Jell-O                                     Grape juice                           Apple juice Coffee or tea                        Jell-O                                      Popsicle                                                Coffee or tea                        Coffee or tea  _____________________________________________________________________  [facility] - Preparing for Surgery Before surgery, you can play an important role.  Because skin is not sterile, your skin needs to be as free of germs as possible.  You can reduce the number of germs on your skin by washing with CHG (chlorahexidine gluconate) soap before surgery.  CHG is an antiseptic cleaner which kills germs and bonds with the skin to continue killing germs even after washing. Please DO NOT use if you have an allergy to CHG or antibacterial soaps.  If your skin becomes reddened/irritated stop using the CHG and inform your nurse when you arrive at Short Stay. Do not shave (including legs and underarms) for at least 48 hours prior to the first CHG shower.  You may shave your face/neck. Please follow these instructions carefully:  1.  Shower with CHG Soap the night before surgery and the  morning of Surgery.  2.  If you choose to wash your hair, wash your hair first as usual with your  normal  shampoo.  3.  After you shampoo, rinse your hair and body thoroughly to remove the  shampoo.  4.  Use CHG as you would any other liquid soap.  You can  apply chg directly  to the skin and wash                       Gently with a scrungie or clean washcloth.  5.  Apply the CHG Soap to your body ONLY FROM THE NECK DOWN.   Do not use on face/ open                           Wound or open sores. Avoid contact with eyes, ears mouth and genitals (private parts).                       Wash face,  Genitals (private parts) with your normal soap.             6.  Wash thoroughly, paying special attention to the area where your surgery  will be performed.  7.  Thoroughly rinse your body with warm water from the neck down.  8.  DO NOT shower/wash with your normal soap after using and rinsing off  the CHG Soap.                9.  Pat yourself dry with a clean towel.            10.  Wear clean pajamas.            11.  Place clean sheets on your bed the night of your first shower and do not  sleep with pets. Day of Surgery : Do not apply any lotions/deodorants the morning of surgery.  Please wear clean clothes to the hospital/surgery center.  FAILURE TO FOLLOW THESE INSTRUCTIONS MAY RESULT IN THE CANCELLATION OF YOUR SURGERY PATIENT SIGNATURE_________________________________  NURSE SIGNATURE__________________________________  ________________________________________________________________________   Adam Phenix  An incentive spirometer is a tool that can help keep your lungs clear and active. This tool measures how well you are filling your lungs with each breath. Taking long deep breaths may help reverse or decrease the chance of developing breathing (pulmonary) problems (especially infection) following:  A long period of time when you are unable to move or be active. BEFORE THE PROCEDURE   If the spirometer includes an indicator to show your best effort, your nurse or respiratory therapist will set it to a desired goal.  If possible, sit up straight or lean slightly forward. Try not to slouch.  Hold the incentive spirometer in an upright  position. INSTRUCTIONS FOR USE   Sit on the edge of your bed if possible, or sit up as far as you can in bed or on a chair.  Hold the incentive spirometer in an upright position.  Breathe out normally.  Place the mouthpiece in your mouth and seal your lips tightly around it.  Breathe in slowly and as deeply as possible, raising the piston or the ball toward the top of the column.  Hold your breath for 3-5 seconds or for as long as possible. Allow the piston or ball to fall to the bottom of the column.  Remove the mouthpiece from your mouth and breathe out normally.  Rest for a few seconds and repeat Steps 1 through 7 at least 10 times every 1-2 hours when you are awake. Take your time and take a few normal breaths between deep breaths.  The spirometer may include an indicator to  show your best effort. Use the indicator as a goal to work toward during each repetition.  After each set of 10 deep breaths, practice coughing to be sure your lungs are clear. If you have an incision (the cut made at the time of surgery), support your incision when coughing by placing a pillow or rolled up towels firmly against it. Once you are able to get out of bed, walk around indoors and cough well. You may stop using the incentive spirometer when instructed by your caregiver.  RISKS AND COMPLICATIONS  Take your time so you do not get dizzy or light-headed.  If you are in pain, you may need to take or ask for pain medication before doing incentive spirometry. It is harder to take a deep breath if you are having pain. AFTER USE  Rest and breathe slowly and easily.  It can be helpful to keep track of a log of your progress. Your caregiver can provide you with a simple table to help with this. If you are using the spirometer at home, follow these instructions: [place] IF:   You are having difficultly using the spirometer.  You have trouble using the spirometer as often as instructed.  Your  pain medication is not giving enough relief while using the spirometer.  You develop fever of 100.5 F (38.1 C) or higher. SEEK IMMEDIATE MEDICAL CARE IF:   You cough up bloody sputum that had not been present before.  You develop fever of 102 F (38.9 C) or greater.  You develop worsening pain at or near the incision site. MAKE SURE YOU:   Understand these instructions.  Will watch your condition.  Will get help right away if you are not doing well or get worse. Document Released: 01/31/2007 Document Revised: 12/13/2011 Document Reviewed: 04/03/2007 Exit[employer] Patient Information 2014 Exit[employer], [place].   ________________________________________________________________________  WHAT IS A BLOOD TRANSFUSION? Blood Transfusion Information  A transfusion is the replacement of blood or some of its parts. Blood is made up of multiple cells which provide different functions.  Red blood cells carry oxygen and are used for blood loss replacement.  White blood cells fight against infection.  Platelets control bleeding.  Plasma helps clot blood.  Other blood products are available for specialized needs, such as hemophilia or other clotting disorders. BEFORE THE TRANSFUSION  Who gives blood for transfusions?   Healthy volunteers who are fully evaluated to make sure their blood is safe. This is blood bank blood. Transfusion therapy is the safest it has ever been in the practice of medicine. Before blood is taken from a donor, a complete history is taken to make sure that person has no history of diseases nor engages in risky social behavior (examples are intravenous drug use or sexual activity with multiple partners). The donor's travel history is screened to minimize risk of transmitting infections, such as malaria. The donated blood is tested for signs of infectious diseases, such as HIV and hepatitis. The blood is then tested to be sure it is compatible with you in order to minimize the  chance of a transfusion reaction. If you or a relative donates blood, this is often done in anticipation of surgery and is not appropriate for emergency situations. It takes many days to process the donated blood. RISKS AND COMPLICATIONS Although transfusion therapy is very safe and saves many lives, the main dangers of transfusion include:   Getting an infectious disease.  Developing a transfusion reaction. This is an allergic reaction  to something in the blood you were given. Every precaution is taken to prevent this. The decision to have a blood transfusion has been considered carefully by your caregiver before blood is given. Blood is not given unless the benefits outweigh the risks. AFTER THE TRANSFUSION  Right after receiving a blood transfusion, you will usually feel much better and more energetic. This is especially true if your red blood cells have gotten low (anemic). The transfusion raises the level of the red blood cells which carry oxygen, and this usually causes an energy increase.  The nurse administering the transfusion will monitor you carefully for complications. HOME CARE INSTRUCTIONS  No special instructions are needed after a transfusion. You may find your energy is better. Speak with your caregiver about any limitations on activity for underlying diseases you may have. SEEK MEDICAL CARE IF:   Your condition is not improving after your transfusion.  You develop redness or irritation at the intravenous (IV) site. SEEK IMMEDIATE MEDICAL CARE IF:  Any of the following symptoms occur over the next 12 hours:  Shaking chills.  You have a temperature by mouth above 102 F (38.9 C), not controlled by medicine.  Chest, back, or muscle pain.  People around you feel you are not acting correctly or are confused.  Shortness of breath or difficulty breathing.  Dizziness and fainting.  You get a rash or develop hives.  You have a decrease in urine output.  Your urine  turns a dark color or changes to pink, red, or brown. Any of the following symptoms occur over the next 10 days:  You have a temperature by mouth above 102 F (38.9 C), not controlled by medicine.  Shortness of breath.  Weakness after normal activity.  The white part of the eye turns yellow (jaundice).  You have a decrease in the amount of urine or are urinating less often.  Your urine turns a dark color or changes to pink, red, or brown. Document Released: 09/17/2000 Document Revised: 12/13/2011 Document Reviewed: 05/06/2008 [facility] Patient Information 2014 [place], [place].  _______________________________________________________________________

## 2015-07-09 ENCOUNTER — Encounter (HOSPITAL_COMMUNITY): Payer: Self-pay

## 2015-07-09 ENCOUNTER — Encounter (HOSPITAL_COMMUNITY)
Admission: RE | Admit: 2015-07-09 | Discharge: 2015-07-09 | Disposition: A | Discharge status: Home / Self Care | Payer: Medicare Other | Source: Ambulatory Visit | Attending: Orthopedic Surgery | Admitting: Orthopedic Surgery

## 2015-07-09 DIAGNOSIS — M179 Osteoarthritis of knee, unspecified: Secondary | ICD-10-CM | POA: Diagnosis not present

## 2015-07-09 DIAGNOSIS — Z01818 Encounter for other preprocedural examination: Secondary | ICD-10-CM | POA: Insufficient documentation

## 2015-07-09 LAB — URINALYSIS, ROUTINE W REFLEX MICROSCOPIC
BILIRUBIN URINE: NEGATIVE
GLUCOSE, UA: NEGATIVE mg/dL
Ketones, ur: NEGATIVE mg/dL
Nitrite: NEGATIVE
PH: 5 (ref 5.0–8.0)
Protein, ur: NEGATIVE mg/dL
SPECIFIC GRAVITY, URINE: 1.011 (ref 1.005–1.030)
UROBILINOGEN UA: 0.2 mg/dL (ref 0.0–1.0)

## 2015-07-09 LAB — COMPREHENSIVE METABOLIC PANEL
ALBUMIN: 3.9 g/dL (ref 3.5–5.0)
ALT: 15 U/L (ref 14–54)
ANION GAP: 6 (ref 5–15)
AST: 18 U/L (ref 15–41)
Alkaline Phosphatase: 99 U/L (ref 38–126)
BILIRUBIN TOTAL: 0.6 mg/dL (ref 0.3–1.2)
BUN: 37 mg/dL — ABNORMAL HIGH (ref 6–20)
CO2: 29 mmol/L (ref 22–32)
Calcium: 9.7 mg/dL (ref 8.9–10.3)
Chloride: 104 mmol/L (ref 101–111)
Creatinine, Ser: 1.48 mg/dL — ABNORMAL HIGH (ref 0.44–1.00)
GFR calc non Af Amer: 31 mL/min — ABNORMAL LOW (ref >60)
GFR, EST AFRICAN AMERICAN: 36 mL/min — AB (ref >60)
GLUCOSE: 93 mg/dL (ref 65–99)
POTASSIUM: 5.5 mmol/L — AB (ref 3.5–5.1)
SODIUM: 139 mmol/L (ref 135–145)
TOTAL PROTEIN: 7.2 g/dL (ref 6.5–8.1)

## 2015-07-09 LAB — CBC
HEMATOCRIT: 38.1 % (ref 36.0–46.0)
Hemoglobin: 12.2 g/dL (ref 12.0–15.0)
MCH: 32.1 pg (ref 26.0–34.0)
MCHC: 32 g/dL (ref 30.0–36.0)
MCV: 100.3 fL — ABNORMAL HIGH (ref 78.0–100.0)
Platelets: 435 10*3/uL — ABNORMAL HIGH (ref 150–400)
RBC: 3.8 MIL/uL — ABNORMAL LOW (ref 3.87–5.11)
RDW: 15.1 % (ref 11.5–15.5)
WBC: 6.9 10*3/uL (ref 4.0–10.5)

## 2015-07-09 LAB — APTT: APTT: 30 s (ref 24–37)

## 2015-07-09 LAB — PROTIME-INR
INR: 1.02 (ref 0.00–1.49)
Prothrombin Time: 13.6 seconds (ref 11.6–15.2)

## 2015-07-09 LAB — SURGICAL PCR SCREEN
MRSA, PCR: NEGATIVE
Staphylococcus aureus: POSITIVE — AB

## 2015-07-09 LAB — URINE MICROSCOPIC-ADD ON

## 2015-07-09 LAB — ABO/RH: ABO/RH(D): A POS

## 2015-07-09 NOTE — Progress Notes (Signed)
03/10/2015-NOTED IN epic ekg. 06/25/2015-Pre-operative clearance from Dr. Marianna Payment on chart and last office visit on chart.

## 2015-07-10 NOTE — Pre-Procedure Instructions (Signed)
07-10-15 0935 Pt made aware of Positive Staph aureus PCR- will pick up Rx from [facility] for Mupirocin and use as directed.

## 2015-07-13 ENCOUNTER — Ambulatory Visit: Payer: Self-pay | Admitting: Orthopedic Surgery

## 2015-07-13 NOTE — H&P (Signed)
Sharon Gordon DOB: July 09, 1928 Married / Language: English / Race: White Female Date of Admission:  07/14/2015 CC:  Right Knee Pain History of Present Illness The patient is a 81 year old female who comes in for a preoperative History and Physical. The patient is scheduled for a right total knee arthroplasty to be performed by Dr. Dione Plover. Aluisio, MD at [facility] on 07-14-2015. The patient is a 81 year old female who presented for follow up of their knee. The patient is being followed for their right knee pain. They are now months out from from cortisone injection. Symptoms reported include: pain and swelling (minimal). The patient feels that they are doing poorly and report their pain level to be mild to moderate. The following medication has been used for pain control: Diclofenac, Ibuprofen and/or aspirin. Sharon Gordon comes in for recheck of her right knee. She is now couple months out from her last cortisone injection after her arthroscopy back in early summer. She has re-gathered her effusion and is having discomfort. She has got a rapidly progressive arthritis of the lateral compartment. She also has patellofemoral arthritis at the time of her scope. At this point, it will be useless to do the viscosupplements. She just needs to go ahead and get this replaced. We did discuss knee replacement in detail. Procedure risks, potential complications, rehab course and she wants to proceed. She is ready to proceed with surgery at this time. They have been treated conservatively in the past for the above stated problem and despite conservative measures and arthroscopyt, they continue to have progressive pain and severe functional limitations and dysfunction. They have failed non-operative management including home exercise, medications, and injections. It is felt that they would benefit from undergoing total joint replacement. Risks and benefits of the procedure have been discussed with the patient and they  elect to proceed with surgery. There are no active contraindications to surgery such as ongoing infection or rapidly progressive neurological disease.  Problem List/Past Medical Effusion of right knee (M25.461) Greater trochanteric bursitis of left hip (M70.62) Primary osteoarthritis of left knee (M17.12) Chronic Pain High blood pressure Hypercholesterolemia Anxiety Disorder Skin Cancer (Not melanoma)  Allergies No Known Drug Allergies  Family History Heart Disease mother and sister Heart disease in female family member before age 21 Hypertension sister Cerebrovascular Accident mother and sister  Social History Drug/Alcohol Rehab (Previously) no Drug/Alcohol Rehab (Currently) no Current work status retired No alcohol use Number of flights of stairs before winded greater than 5 Marital status married Living situation live with spouse Exercise Exercises weekly; does running / walking Children 2 Alcohol use current drinker; drinks hard liquor; 15 or more per week Tobacco use Never smoker. former smoker; smoke(d) 3 or more pack(s) per day; uses 2 or more can(s) smokeless per week Tobacco / smoke exposure no Pain Contract no  Medication History OxyCODONE HCl (5MG  Tablet, 1 (one) Oral every 4-6 hours as needed for pain, Taken starting 05/15/2015) Active. TraMADol HCl (50MG  Tablet, Oral) Active. Methocarbamol (500MG  Tablet, Oral) Active. Percocet (5-325MG  Tablet, Oral) Active. Aspirin (Oral) Specific dose unknown - Active. Levothyroxine Sodium (50MCG Tablet, Oral) Active. Lisinopril-Hydrochlorothiazide (20-12.5MG  Tablet, Oral every 12 hrs) Active. DULoxetine HCl (30MG  Capsule DR Part, Oral daily) Active. AmLODIPine Besylate (10MG  Tablet, Oral daily) Active. Gabapentin (300MG  Capsule, 2 Oral three times daily) Active. Diclofenac Sodium (75MG  Tablet DR, Oral two times daily) Active. LORazepam (0.5MG  Tablet, Oral) Active. Detrol LA (2MG   Capsule ER 24HR, Oral) Active. Polyethylene Glycol Active.  Past  Surgical History Hysterectomy (not due to cancer) - Complete Fibroids Back Surgery Bladder Surgery  Review of Systems (Ceaser Ebeling L. Jeison Delpilar III PA-C; 07/10/2015 3:54 PM) General Not Present- Chills, Fatigue, Fever, Memory Loss, Night Sweats, Weight Gain and Weight Loss. Skin Not Present- Eczema, Hives, Itching, Lesions and Rash. HEENT Not Present- Dentures, Double Vision, Headache, Hearing Loss, Tinnitus and Visual Loss. Respiratory Not Present- Allergies, Chronic Cough, Coughing up blood, Shortness of breath at rest and Shortness of breath with exertion. Cardiovascular Not Present- Chest Pain, Difficulty Breathing Lying Down, Murmur, Palpitations, Racing/skipping heartbeats and Swelling. Gastrointestinal Not Present- Abdominal Pain, Bloody Stool, Constipation, Diarrhea, Difficulty Swallowing, Heartburn, Jaundice, Loss of appetitie, Nausea and Vomiting. Female Genitourinary Not Present- Blood in Urine, Discharge, Flank Pain, Incontinence, Painful Urination, Urgency, Urinary frequency, Urinary Retention, Urinating at Night and Weak urinary stream. Musculoskeletal Present- Joint Pain. Not Present- Back Pain, Joint Swelling, Morning Stiffness, Muscle Pain, Muscle Weakness and Spasms. Neurological Not Present- Blackout spells, Difficulty with balance, Dizziness, Paralysis, Tremor and Weakness. Psychiatric Not Present- Insomnia.  Vitals Weight: 130 lb Height: 64in Weight was reported by patient. Height was reported by patient. Body Surface Area: 1.63 m Body Mass Index: 22.31 kg/m  BP: 108/66 (Sitting, Right Arm, Standard)  Physical Exam General Mental Status -Alert, cooperative and good historian. General Appearance-pleasant, Not in acute distress. Orientation-Oriented X3. Build & Nutrition-Well nourished and Well developed.  Head and Neck Head-normocephalic, atraumatic . Neck Global Assessment -  supple, no bruit auscultated on the right, no bruit auscultated on the left.  Eye Vision-Wears corrective lenses. Pupil - Bilateral-Regular and Round. Motion - Bilateral-EOMI.  Chest and Lung Exam Auscultation Breath sounds - clear at anterior chest wall and clear at posterior chest wall. Adventitious sounds - No Adventitious sounds.  Cardiovascular Auscultation Rhythm - Regular rate and rhythm. Heart Sounds - S1 WNL and S2 WNL. Murmurs & Other Heart Sounds - Auscultation of the heart reveals - No Murmurs.  Abdomen Palpation/Percussion Tenderness - Abdomen is non-tender to palpation. Rigidity (guarding) - Abdomen is soft. Auscultation Auscultation of the abdomen reveals - Bowel sounds normal.  Female Genitourinary Note: Not done, not pertinent to present illness   Musculoskeletal Note: On exam, she is alert and oriented, in no apparent distress. Her right knee shows moderate effusion. There is no warmth about the knee. The range about 0 to 115. She is very tender laterally, but no medial tenderness or instability noted.  RADIOGRAPHS Radiographs are pretty impressive in that she has now gone to complete bone-on-bone in the lateral compartment and gone into about 10 degrees of valgus. This is a significant change from preop.  Assessment & Plan Primary osteoarthritis of right knee (M17.11) Primary osteoarthritis of left knee (M17.12) Note:Surgical Plans: Right Total Knee Replacement  Disposition: Home  PCP: Dr. Marianna Payment - Patient has been seen preoperatively and felt to be stable for surgery.  IV TXA  Anesthesia Issues: None  Signed electronically by Joelene Millin, III PA-C

## 2015-07-14 ENCOUNTER — Inpatient Hospital Stay (HOSPITAL_COMMUNITY): Payer: Medicare Other | Admitting: Anesthesiology

## 2015-07-14 ENCOUNTER — Encounter (HOSPITAL_COMMUNITY): Payer: Self-pay | Admitting: *Deleted

## 2015-07-14 ENCOUNTER — Inpatient Hospital Stay (HOSPITAL_COMMUNITY)
Admission: RE | Admit: 2015-07-14 | Discharge: 2015-07-16 | DRG: 470 | Disposition: A | Discharge status: Home Health | Payer: Medicare Other | Source: Ambulatory Visit | Attending: Orthopedic Surgery | Admitting: Orthopedic Surgery

## 2015-07-14 ENCOUNTER — Encounter (HOSPITAL_COMMUNITY)
Admission: RE | Disposition: A | Discharge status: Home Health | Payer: Self-pay | Source: Ambulatory Visit | Attending: Orthopedic Surgery

## 2015-07-14 DIAGNOSIS — Z823 Family history of stroke: Secondary | ICD-10-CM

## 2015-07-14 DIAGNOSIS — F419 Anxiety disorder, unspecified: Secondary | ICD-10-CM | POA: Diagnosis present

## 2015-07-14 DIAGNOSIS — M7062 Trochanteric bursitis, left hip: Secondary | ICD-10-CM | POA: Diagnosis present

## 2015-07-14 DIAGNOSIS — E039 Hypothyroidism, unspecified: Secondary | ICD-10-CM | POA: Diagnosis present

## 2015-07-14 DIAGNOSIS — G8929 Other chronic pain: Secondary | ICD-10-CM | POA: Diagnosis present

## 2015-07-14 DIAGNOSIS — E7800 Pure hypercholesterolemia, unspecified: Secondary | ICD-10-CM | POA: Diagnosis present

## 2015-07-14 DIAGNOSIS — Z79891 Long term (current) use of opiate analgesic: Secondary | ICD-10-CM | POA: Diagnosis not present

## 2015-07-14 DIAGNOSIS — M170 Bilateral primary osteoarthritis of knee: Principal | ICD-10-CM | POA: Diagnosis present

## 2015-07-14 DIAGNOSIS — Z79899 Other long term (current) drug therapy: Secondary | ICD-10-CM | POA: Diagnosis not present

## 2015-07-14 DIAGNOSIS — C4490 Unspecified malignant neoplasm of skin, unspecified: Secondary | ICD-10-CM | POA: Diagnosis present

## 2015-07-14 DIAGNOSIS — M25761 Osteophyte, right knee: Secondary | ICD-10-CM | POA: Diagnosis present

## 2015-07-14 DIAGNOSIS — I10 Essential (primary) hypertension: Secondary | ICD-10-CM | POA: Diagnosis present

## 2015-07-14 DIAGNOSIS — Z01812 Encounter for preprocedural laboratory examination: Secondary | ICD-10-CM | POA: Diagnosis not present

## 2015-07-14 DIAGNOSIS — Z7982 Long term (current) use of aspirin: Secondary | ICD-10-CM

## 2015-07-14 DIAGNOSIS — M1711 Unilateral primary osteoarthritis, right knee: Secondary | ICD-10-CM

## 2015-07-14 DIAGNOSIS — Z8249 Family history of ischemic heart disease and other diseases of the circulatory system: Secondary | ICD-10-CM | POA: Diagnosis not present

## 2015-07-14 DIAGNOSIS — M1710 Unilateral primary osteoarthritis, unspecified knee: Secondary | ICD-10-CM | POA: Diagnosis present

## 2015-07-14 DIAGNOSIS — M25561 Pain in right knee: Secondary | ICD-10-CM | POA: Diagnosis present

## 2015-07-14 DIAGNOSIS — M179 Osteoarthritis of knee, unspecified: Secondary | ICD-10-CM | POA: Diagnosis present

## 2015-07-14 HISTORY — PX: TOTAL KNEE ARTHROPLASTY: SHX125

## 2015-07-14 LAB — TYPE AND SCREEN
ABO/RH(D): A POS
ANTIBODY SCREEN: NEGATIVE

## 2015-07-14 SURGERY — ARTHROPLASTY, KNEE, TOTAL
Anesthesia: General | Site: Knee | Laterality: Right

## 2015-07-14 MED ORDER — SUCCINYLCHOLINE CHLORIDE 20 MG/ML IJ SOLN
INTRAMUSCULAR | Status: DC | PRN
  Administered 2015-07-14: 60 mg via INTRAVENOUS

## 2015-07-14 MED ORDER — ACETAMINOPHEN 500 MG PO TABS
1000.0000 mg | ORAL_TABLET | Freq: Four times a day (QID) | ORAL | Status: AC
  Administered 2015-07-14 – 2015-07-15 (×4): 1000 mg via ORAL
  Filled 2015-07-14 (×4): qty 2

## 2015-07-14 MED ORDER — APIXABAN 2.5 MG PO TABS
2.5000 mg | ORAL_TABLET | Freq: Two times a day (BID) | ORAL | Status: DC
Start: 2015-07-15 — End: 2015-07-16
  Administered 2015-07-15 – 2015-07-16 (×3): 2.5 mg via ORAL
  Filled 2015-07-14 (×5): qty 1

## 2015-07-14 MED ORDER — ONDANSETRON HCL 4 MG/2ML IJ SOLN
INTRAMUSCULAR | Status: DC | PRN
  Administered 2015-07-14: 4 mg via INTRAVENOUS

## 2015-07-14 MED ORDER — PROPOFOL 10 MG/ML IV BOLUS
INTRAVENOUS | Status: DC | PRN
  Administered 2015-07-14: 110 mg via INTRAVENOUS

## 2015-07-14 MED ORDER — FLEET ENEMA 7-19 GM/118ML RE ENEM: 1.0000 | ENEMA | Freq: Once | RECTAL | Status: DC | PRN

## 2015-07-14 MED ORDER — FENTANYL CITRATE (PF) 100 MCG/2ML IJ SOLN
25.0000 ug | INTRAMUSCULAR | Status: DC | PRN
  Administered 2015-07-14 (×4): 50 ug via INTRAVENOUS

## 2015-07-14 MED ORDER — EPHEDRINE SULFATE 50 MG/ML IJ SOLN
INTRAMUSCULAR | Status: AC
  Filled 2015-07-14: qty 1

## 2015-07-14 MED ORDER — BUPIVACAINE LIPOSOME 1.3 % IJ SUSP
INTRAMUSCULAR | Status: DC | PRN
  Administered 2015-07-14: 20 mL

## 2015-07-14 MED ORDER — HYDROMORPHONE HCL 1 MG/ML IJ SOLN
INTRAMUSCULAR | Status: DC | PRN
  Administered 2015-07-14 (×2): 1 mg via INTRAVENOUS

## 2015-07-14 MED ORDER — NEOSTIGMINE METHYLSULFATE 10 MG/10ML IV SOLN
INTRAVENOUS | Status: AC
  Filled 2015-07-14: qty 1

## 2015-07-14 MED ORDER — METOCLOPRAMIDE HCL 10 MG PO TABS: 5.0000 mg | ORAL_TABLET | Freq: Three times a day (TID) | ORAL | Status: DC | PRN

## 2015-07-14 MED ORDER — LACTATED RINGERS IV SOLN
INTRAVENOUS | Status: DC
  Administered 2015-07-14 (×2): via INTRAVENOUS
  Administered 2015-07-14: 1000 mL via INTRAVENOUS

## 2015-07-14 MED ORDER — FENTANYL CITRATE (PF) 100 MCG/2ML IJ SOLN
INTRAMUSCULAR | Status: AC
  Filled 2015-07-14: qty 2

## 2015-07-14 MED ORDER — METHOCARBAMOL 1000 MG/10ML IJ SOLN: 500.0000 mg | Freq: Four times a day (QID) | INTRAVENOUS | Status: DC | PRN

## 2015-07-14 MED ORDER — LABETALOL HCL 5 MG/ML IV SOLN
INTRAVENOUS | Status: AC
  Filled 2015-07-14: qty 4

## 2015-07-14 MED ORDER — GABAPENTIN 300 MG PO CAPS
600.0000 mg | ORAL_CAPSULE | Freq: Three times a day (TID) | ORAL | Status: DC
  Administered 2015-07-14 – 2015-07-16 (×5): 600 mg via ORAL
  Filled 2015-07-14 (×7): qty 2

## 2015-07-14 MED ORDER — LABETALOL HCL 5 MG/ML IV SOLN
INTRAVENOUS | Status: DC | PRN
  Administered 2015-07-14 (×2): 2.5 mg via INTRAVENOUS

## 2015-07-14 MED ORDER — MORPHINE SULFATE (PF) 2 MG/ML IV SOLN
1.0000 mg | INTRAVENOUS | Status: DC | PRN
  Administered 2015-07-14 – 2015-07-15 (×2): 1 mg via INTRAVENOUS
  Filled 2015-07-14 (×2): qty 1

## 2015-07-14 MED ORDER — SODIUM CHLORIDE 0.9 % IJ SOLN
INTRAMUSCULAR | Status: AC
  Filled 2015-07-14: qty 50

## 2015-07-14 MED ORDER — BUPIVACAINE LIPOSOME 1.3 % IJ SUSP
20.0000 mL | Freq: Once | INTRAMUSCULAR | Status: DC
  Filled 2015-07-14: qty 20

## 2015-07-14 MED ORDER — ACETAMINOPHEN 10 MG/ML IV SOLN
1000.0000 mg | Freq: Once | INTRAVENOUS | Status: AC
  Administered 2015-07-14: 1000 mg via INTRAVENOUS

## 2015-07-14 MED ORDER — ESMOLOL HCL 10 MG/ML IV SOLN
INTRAVENOUS | Status: AC
  Filled 2015-07-14: qty 10

## 2015-07-14 MED ORDER — SODIUM CHLORIDE 0.9 % IJ SOLN
INTRAMUSCULAR | Status: DC | PRN
  Administered 2015-07-14: 30 mL

## 2015-07-14 MED ORDER — DOCUSATE SODIUM 100 MG PO CAPS
100.0000 mg | ORAL_CAPSULE | Freq: Two times a day (BID) | ORAL | Status: DC
Start: 2015-07-14 — End: 2015-07-16
  Administered 2015-07-14 – 2015-07-16 (×4): 100 mg via ORAL

## 2015-07-14 MED ORDER — METHOCARBAMOL 500 MG PO TABS
500.0000 mg | ORAL_TABLET | Freq: Four times a day (QID) | ORAL | Status: DC | PRN
  Administered 2015-07-15 – 2015-07-16 (×4): 500 mg via ORAL
  Filled 2015-07-14 (×4): qty 1

## 2015-07-14 MED ORDER — KETOROLAC TROMETHAMINE 15 MG/ML IJ SOLN: 7.5000 mg | Freq: Four times a day (QID) | INTRAMUSCULAR | Status: DC | PRN

## 2015-07-14 MED ORDER — LIDOCAINE HCL (CARDIAC) 20 MG/ML IV SOLN
INTRAVENOUS | Status: DC | PRN
  Administered 2015-07-14: 50 mg via INTRAVENOUS

## 2015-07-14 MED ORDER — ESMOLOL HCL 10 MG/ML IV SOLN
INTRAVENOUS | Status: DC | PRN
Start: 2015-07-14 — End: 2015-07-14
  Administered 2015-07-14: 20 mg via INTRAVENOUS

## 2015-07-14 MED ORDER — LIDOCAINE HCL (CARDIAC) 20 MG/ML IV SOLN
INTRAVENOUS | Status: AC
  Filled 2015-07-14: qty 5

## 2015-07-14 MED ORDER — ACETAMINOPHEN 10 MG/ML IV SOLN
INTRAVENOUS | Status: AC
  Filled 2015-07-14: qty 100

## 2015-07-14 MED ORDER — FENTANYL CITRATE (PF) 100 MCG/2ML IJ SOLN
INTRAMUSCULAR | Status: AC
  Filled 2015-07-14: qty 4

## 2015-07-14 MED ORDER — DEXAMETHASONE SODIUM PHOSPHATE 10 MG/ML IJ SOLN
INTRAMUSCULAR | Status: AC
  Filled 2015-07-14: qty 1

## 2015-07-14 MED ORDER — DIPHENHYDRAMINE HCL 12.5 MG/5ML PO ELIX: 12.5000 mg | ORAL_SOLUTION | ORAL | Status: DC | PRN

## 2015-07-14 MED ORDER — METOCLOPRAMIDE HCL 5 MG/ML IJ SOLN: 5.0000 mg | Freq: Three times a day (TID) | INTRAMUSCULAR | Status: DC | PRN

## 2015-07-14 MED ORDER — BISACODYL 10 MG RE SUPP: 10.0000 mg | Freq: Every day | RECTAL | Status: DC | PRN

## 2015-07-14 MED ORDER — ROCURONIUM BROMIDE 100 MG/10ML IV SOLN
INTRAVENOUS | Status: DC | PRN
  Administered 2015-07-14: 30 mg via INTRAVENOUS

## 2015-07-14 MED ORDER — GLYCOPYRROLATE 0.2 MG/ML IJ SOLN
INTRAMUSCULAR | Status: DC | PRN
  Administered 2015-07-14: 0.6 mg via INTRAVENOUS

## 2015-07-14 MED ORDER — PROPOFOL 10 MG/ML IV BOLUS
INTRAVENOUS | Status: AC
  Filled 2015-07-14: qty 20

## 2015-07-14 MED ORDER — BUPIVACAINE HCL (PF) 0.25 % IJ SOLN
INTRAMUSCULAR | Status: AC
  Filled 2015-07-14: qty 30

## 2015-07-14 MED ORDER — POLYETHYLENE GLYCOL 3350 17 G PO PACK: 17.0000 g | PACK | Freq: Every day | ORAL | Status: DC | PRN

## 2015-07-14 MED ORDER — DEXAMETHASONE SODIUM PHOSPHATE 10 MG/ML IJ SOLN
10.0000 mg | Freq: Once | INTRAMUSCULAR | Status: AC
  Administered 2015-07-14: 10 mg via INTRAVENOUS

## 2015-07-14 MED ORDER — OXYCODONE HCL 5 MG PO TABS
5.0000 mg | ORAL_TABLET | ORAL | Status: DC | PRN
  Administered 2015-07-14 – 2015-07-15 (×2): 5 mg via ORAL
  Administered 2015-07-15 (×4): 10 mg via ORAL
  Administered 2015-07-16: 5 mg via ORAL
  Administered 2015-07-16 (×2): 10 mg via ORAL
  Administered 2015-07-16: 5 mg via ORAL
  Filled 2015-07-14: qty 1
  Filled 2015-07-14 (×6): qty 2
  Filled 2015-07-14: qty 1
  Filled 2015-07-14: qty 2
  Filled 2015-07-14: qty 1

## 2015-07-14 MED ORDER — MENTHOL 3 MG MT LOZG
1.0000 | LOZENGE | OROMUCOSAL | Status: DC | PRN
  Filled 2015-07-14: qty 9

## 2015-07-14 MED ORDER — GLYCOPYRROLATE 0.2 MG/ML IJ SOLN
INTRAMUSCULAR | Status: AC
  Filled 2015-07-14: qty 2

## 2015-07-14 MED ORDER — LEVOTHYROXINE SODIUM 50 MCG PO TABS
50.0000 ug | ORAL_TABLET | Freq: Every day | ORAL | Status: DC
  Administered 2015-07-15 – 2015-07-16 (×2): 50 ug via ORAL
  Filled 2015-07-14 (×3): qty 1

## 2015-07-14 MED ORDER — ONDANSETRON HCL 4 MG/2ML IJ SOLN
INTRAMUSCULAR | Status: AC
  Filled 2015-07-14: qty 2

## 2015-07-14 MED ORDER — CEFAZOLIN SODIUM-DEXTROSE 2-3 GM-% IV SOLR
INTRAVENOUS | Status: AC
  Filled 2015-07-14: qty 50

## 2015-07-14 MED ORDER — CEFAZOLIN SODIUM-DEXTROSE 2-3 GM-% IV SOLR
2.0000 g | INTRAVENOUS | Status: AC
  Administered 2015-07-14: 2 g via INTRAVENOUS

## 2015-07-14 MED ORDER — FENTANYL CITRATE (PF) 100 MCG/2ML IJ SOLN
INTRAMUSCULAR | Status: DC | PRN
  Administered 2015-07-14 (×2): 50 ug via INTRAVENOUS
  Administered 2015-07-14: 100 ug via INTRAVENOUS

## 2015-07-14 MED ORDER — DULOXETINE HCL 30 MG PO CPEP
30.0000 mg | ORAL_CAPSULE | Freq: Every morning | ORAL | Status: DC
  Administered 2015-07-15 – 2015-07-16 (×2): 30 mg via ORAL
  Filled 2015-07-14 (×2): qty 1

## 2015-07-14 MED ORDER — NEOSTIGMINE METHYLSULFATE 10 MG/10ML IV SOLN
INTRAVENOUS | Status: DC | PRN
  Administered 2015-07-14: 4 mg via INTRAVENOUS

## 2015-07-14 MED ORDER — BUPIVACAINE HCL 0.25 % IJ SOLN
INTRAMUSCULAR | Status: DC | PRN
Start: 2015-07-14 — End: 2015-07-14
  Administered 2015-07-14: 20 mL

## 2015-07-14 MED ORDER — ONDANSETRON HCL 4 MG/2ML IJ SOLN: 4.0000 mg | Freq: Four times a day (QID) | INTRAMUSCULAR | Status: DC | PRN

## 2015-07-14 MED ORDER — SODIUM CHLORIDE 0.9 % IV SOLN
INTRAVENOUS | Status: DC
  Administered 2015-07-15: via INTRAVENOUS

## 2015-07-14 MED ORDER — ACETAMINOPHEN 650 MG RE SUPP: 650.0000 mg | Freq: Four times a day (QID) | RECTAL | Status: DC | PRN

## 2015-07-14 MED ORDER — ONDANSETRON HCL 4 MG PO TABS: 4.0000 mg | ORAL_TABLET | Freq: Four times a day (QID) | ORAL | Status: DC | PRN

## 2015-07-14 MED ORDER — AMLODIPINE BESYLATE 5 MG PO TABS
5.0000 mg | ORAL_TABLET | Freq: Every evening | ORAL | Status: DC
  Administered 2015-07-14 – 2015-07-15 (×2): 5 mg via ORAL
  Filled 2015-07-14 (×4): qty 1

## 2015-07-14 MED ORDER — HYDROMORPHONE HCL 2 MG/ML IJ SOLN
INTRAMUSCULAR | Status: AC
  Filled 2015-07-14: qty 1

## 2015-07-14 MED ORDER — SODIUM CHLORIDE 0.9 % IV SOLN
1000.0000 mg | INTRAVENOUS | Status: AC
  Administered 2015-07-14: 1000 mg via INTRAVENOUS
  Filled 2015-07-14: qty 10

## 2015-07-14 MED ORDER — ARTIFICIAL TEARS OP OINT
TOPICAL_OINTMENT | OPHTHALMIC | Status: AC
  Filled 2015-07-14: qty 3.5

## 2015-07-14 MED ORDER — CEFAZOLIN SODIUM-DEXTROSE 2-3 GM-% IV SOLR
2.0000 g | Freq: Four times a day (QID) | INTRAVENOUS | Status: AC
  Administered 2015-07-14 – 2015-07-15 (×2): 2 g via INTRAVENOUS
  Filled 2015-07-14 (×2): qty 50

## 2015-07-14 MED ORDER — MORPHINE SULFATE (PF) 10 MG/ML IV SOLN: 1.0000 mg | INTRAVENOUS | Status: DC | PRN

## 2015-07-14 MED ORDER — PHENOL 1.4 % MT LIQD
1.0000 | OROMUCOSAL | Status: DC | PRN
  Filled 2015-07-14: qty 177

## 2015-07-14 MED ORDER — ACETAMINOPHEN 325 MG PO TABS: 650.0000 mg | ORAL_TABLET | Freq: Four times a day (QID) | ORAL | Status: DC | PRN

## 2015-07-14 MED ORDER — TRAMADOL HCL 50 MG PO TABS: 50.0000 mg | ORAL_TABLET | Freq: Four times a day (QID) | ORAL | Status: DC | PRN

## 2015-07-14 SURGICAL SUPPLY — 61 items
BAG DECANTER FOR FLEXI CONT (MISCELLANEOUS) IMPLANT
BAG ZIPLOCK 12X15 (MISCELLANEOUS) ×2 IMPLANT
BANDAGE ELASTIC 6 VELCRO ST LF (GAUZE/BANDAGES/DRESSINGS) ×2 IMPLANT
BANDAGE ESMARK 6X9 LF (GAUZE/BANDAGES/DRESSINGS) ×1 IMPLANT
BLADE SAG 18X100X1.27 (BLADE) ×2 IMPLANT
BLADE SAW SGTL 11.0X1.19X90.0M (BLADE) ×2 IMPLANT
BNDG ESMARK 6X9 LF (GAUZE/BANDAGES/DRESSINGS) ×2
BOWL SMART MIX CTS (DISPOSABLE) ×2 IMPLANT
CAP KNEE TOTAL 3 SIGMA ×2 IMPLANT
CEMENT HV SMART SET (Cement) ×4 IMPLANT
CUFF TOURN SGL QUICK 34 (TOURNIQUET CUFF) ×1
CUFF TRNQT CYL 34X4X40X1 (TOURNIQUET CUFF) ×1 IMPLANT
DECANTER SPIKE VIAL GLASS SM (MISCELLANEOUS) ×2 IMPLANT
DRAPE EXTREMITY T 121X128X90 (DRAPE) ×2 IMPLANT
DRAPE POUCH INSTRU U-SHP 10X18 (DRAPES) ×2 IMPLANT
DRAPE U-SHAPE 47X51 STRL (DRAPES) ×2 IMPLANT
DRSG ADAPTIC 3X8 NADH LF (GAUZE/BANDAGES/DRESSINGS) ×2 IMPLANT
DRSG PAD ABDOMINAL 8X10 ST (GAUZE/BANDAGES/DRESSINGS) ×2 IMPLANT
DURAPREP 26ML APPLICATOR (WOUND CARE) ×2 IMPLANT
ELECT REM PT RETURN 9FT ADLT (ELECTROSURGICAL) ×2
ELECTRODE REM PT RTRN 9FT ADLT (ELECTROSURGICAL) ×1 IMPLANT
EVACUATOR 1/8 PVC DRAIN (DRAIN) ×2 IMPLANT
FACESHIELD WRAPAROUND (MASK) ×10 IMPLANT
GAUZE SPONGE 4X4 12PLY STRL (GAUZE/BANDAGES/DRESSINGS) ×2 IMPLANT
GLOVE BIO SURGEON STRL SZ7.5 (GLOVE) ×2 IMPLANT
GLOVE BIO SURGEON STRL SZ8 (GLOVE) ×2 IMPLANT
GLOVE BIOGEL PI IND STRL 6.5 (GLOVE) IMPLANT
GLOVE BIOGEL PI IND STRL 8 (GLOVE) ×1 IMPLANT
GLOVE BIOGEL PI INDICATOR 6.5 (GLOVE)
GLOVE BIOGEL PI INDICATOR 8 (GLOVE) ×1
GLOVE SURG SS PI 6.5 STRL IVOR (GLOVE) IMPLANT
GOWN STRL REUS W/TWL LRG LVL3 (GOWN DISPOSABLE) ×2 IMPLANT
GOWN STRL REUS W/TWL XL LVL3 (GOWN DISPOSABLE) ×2 IMPLANT
HANDPIECE INTERPULSE COAX TIP (DISPOSABLE) ×1
IMMOBILIZER KNEE 20 (SOFTGOODS) ×2
IMMOBILIZER KNEE 20 THIGH 36 (SOFTGOODS) ×1 IMPLANT
KIT BASIN OR (CUSTOM PROCEDURE TRAY) ×2 IMPLANT
MANIFOLD NEPTUNE II (INSTRUMENTS) ×2 IMPLANT
NDL SAFETY ECLIPSE 18X1.5 (NEEDLE) ×2 IMPLANT
NEEDLE HYPO 18GX1.5 SHARP (NEEDLE) ×2
NS IRRIG 1000ML POUR BTL (IV SOLUTION) ×2 IMPLANT
PACK TOTAL JOINT (CUSTOM PROCEDURE TRAY) ×2 IMPLANT
PADDING CAST COTTON 6X4 STRL (CAST SUPPLIES) ×2 IMPLANT
PEN SKIN MARKING BROAD (MISCELLANEOUS) ×2 IMPLANT
POSITIONER SURGICAL ARM (MISCELLANEOUS) ×2 IMPLANT
SET HNDPC FAN SPRY TIP SCT (DISPOSABLE) ×1 IMPLANT
STRIP CLOSURE SKIN 1/2X4 (GAUZE/BANDAGES/DRESSINGS) ×2 IMPLANT
SUCTION FRAZIER 12FR DISP (SUCTIONS) ×2 IMPLANT
SUT MNCRL AB 4-0 PS2 18 (SUTURE) ×2 IMPLANT
SUT VIC AB 2-0 CT1 27 (SUTURE) ×3
SUT VIC AB 2-0 CT1 TAPERPNT 27 (SUTURE) ×3 IMPLANT
SUT VLOC 180 0 24IN GS25 (SUTURE) ×2 IMPLANT
SYR 20CC LL (SYRINGE) ×2 IMPLANT
SYR 50ML LL SCALE MARK (SYRINGE) ×2 IMPLANT
TOWEL OR 17X26 10 PK STRL BLUE (TOWEL DISPOSABLE) ×2 IMPLANT
TOWEL OR NON WOVEN STRL DISP B (DISPOSABLE) ×2 IMPLANT
TRAY FOLEY W/METER SILVER 14FR (SET/KITS/TRAYS/PACK) ×2 IMPLANT
TRAY FOLEY W/METER SILVER 16FR (SET/KITS/TRAYS/PACK) IMPLANT
WATER STERILE IRR 1500ML POUR (IV SOLUTION) ×2 IMPLANT
WRAP KNEE MAXI GEL POST OP (GAUZE/BANDAGES/DRESSINGS) ×2 IMPLANT
YANKAUER SUCT BULB TIP 10FT TU (MISCELLANEOUS) ×2 IMPLANT

## 2015-07-14 NOTE — H&P (View-Only) (Signed)
Sharon Gordon DOB: [birth year] Married / Language: English / Race: White Female Date of Admission:  07/14/2015 CC:  Right Knee Pain History of Present Illness The patient is a 81 year old female who comes in for a preoperative History and Physical. The patient is scheduled for a right total knee arthroplasty to be performed by Dr. Dione Plover. Aluisio, MD at [facility] on 07-14-2015. The patient is a 81 year old female who presented for follow up of their knee. The patient is being followed for their right knee pain. They are now months out from from cortisone injection. Symptoms reported include: pain and swelling (minimal). The patient feels that they are doing poorly and report their pain level to be mild to moderate. The following medication has been used for pain control: Diclofenac, Ibuprofen and/or aspirin. Sharon Gordon comes in for recheck of her right knee. She is now couple months out from her last cortisone injection after her arthroscopy back in early summer. She has re-gathered her effusion and is having discomfort. She has got a rapidly progressive arthritis of the lateral compartment. She also has patellofemoral arthritis at the time of her scope. At this point, it will be useless to do the viscosupplements. She just needs to go ahead and get this replaced. We did discuss knee replacement in detail. Procedure risks, potential complications, rehab course and she wants to proceed. She is ready to proceed with surgery at this time. They have been treated conservatively in the past for the above stated problem and despite conservative measures and arthroscopyt, they continue to have progressive pain and severe functional limitations and dysfunction. They have failed non-operative management including home exercise, medications, and injections. It is felt that they would benefit from undergoing total joint replacement. Risks and benefits of the procedure have been discussed with the patient and they  elect to proceed with surgery. There are no active contraindications to surgery such as ongoing infection or rapidly progressive neurological disease.  Problem List/Past Medical Effusion of right knee (M25.461) Greater trochanteric bursitis of left hip (M70.62) Primary osteoarthritis of left knee (M17.12) Chronic Pain High blood pressure Hypercholesterolemia Anxiety Disorder Skin Cancer (Not melanoma)  Allergies No Known Drug Allergies  Family History Heart Disease mother and sister Heart disease in female family member before age 61 Hypertension sister Cerebrovascular Accident mother and sister  Social History Drug/Alcohol Rehab (Previously) no Drug/Alcohol Rehab (Currently) no Current work status retired No alcohol use Number of flights of stairs before winded greater than 5 Marital status married Living situation live with spouse Exercise Exercises weekly; does running / walking Children 2 Alcohol use current drinker; drinks hard liquor; 15 or more per week Tobacco use Never smoker. former smoker; smoke(d) 3 or more pack(s) per day; uses 2 or more can(s) smokeless per week Tobacco / smoke exposure no Pain Contract no  Medication History OxyCODONE HCl (5MG  Tablet, 1 (one) Oral every 4-6 hours as needed for pain, Taken starting 05/15/2015) Active. TraMADol HCl (50MG  Tablet, Oral) Active. Methocarbamol (500MG  Tablet, Oral) Active. Percocet (5-325MG  Tablet, Oral) Active. Aspirin (Oral) Specific dose unknown - Active. Levothyroxine Sodium (50MCG Tablet, Oral) Active. Lisinopril-Hydrochlorothiazide (20-12.5MG  Tablet, Oral every 12 hrs) Active. DULoxetine HCl (30MG  Capsule DR Part, Oral daily) Active. AmLODIPine Besylate (10MG  Tablet, Oral daily) Active. Gabapentin (300MG  Capsule, 2 Oral three times daily) Active. Diclofenac Sodium (75MG  Tablet DR, Oral two times daily) Active. LORazepam (0.5MG  Tablet, Oral) Active. Detrol LA (2MG   Capsule ER 24HR, Oral) Active. Polyethylene Glycol Active.  Past  Surgical History Hysterectomy (not due to cancer) - Complete Fibroids Back Surgery Bladder Surgery  Review of Systems (Alexzandrew L. Perkins III PA-C; 07/10/2015 3:54 PM) General Not Present- Chills, Fatigue, Fever, Memory Loss, Night Sweats, Weight Gain and Weight Loss. Skin Not Present- Eczema, Hives, Itching, Lesions and Rash. HEENT Not Present- Dentures, Double Vision, Headache, Hearing Loss, Tinnitus and Visual Loss. Respiratory Not Present- Allergies, Chronic Cough, Coughing up blood, Shortness of breath at rest and Shortness of breath with exertion. Cardiovascular Not Present- Chest Pain, Difficulty Breathing Lying Down, Murmur, Palpitations, Racing/skipping heartbeats and Swelling. Gastrointestinal Not Present- Abdominal Pain, Bloody Stool, Constipation, Diarrhea, Difficulty Swallowing, Heartburn, Jaundice, Loss of appetitie, Nausea and Vomiting. Female Genitourinary Not Present- Blood in Urine, Discharge, Flank Pain, Incontinence, Painful Urination, Urgency, Urinary frequency, Urinary Retention, Urinating at Night and Weak urinary stream. Musculoskeletal Present- Joint Pain. Not Present- Back Pain, Joint Swelling, Morning Stiffness, Muscle Pain, Muscle Weakness and Spasms. Neurological Not Present- Blackout spells, Difficulty with balance, Dizziness, Paralysis, Tremor and Weakness. Psychiatric Not Present- Insomnia.  Vitals Weight: 130 lb Height: 64in Weight was reported by patient. Height was reported by patient. Body Surface Area: 1.63 m Body Mass Index: 22.31 kg/m  BP: 108/66 (Sitting, Right Arm, Standard)  Physical Exam General Mental Status -Alert, cooperative and good historian. General Appearance-pleasant, Not in acute distress. Orientation-Oriented X3. Build & Nutrition-Well nourished and Well developed.  Head and Neck Head-normocephalic, atraumatic . Neck Global Assessment -  supple, no bruit auscultated on the right, no bruit auscultated on the left.  Eye Vision-Wears corrective lenses. Pupil - Bilateral-Regular and Round. Motion - Bilateral-EOMI.  Chest and Lung Exam Auscultation Breath sounds - clear at anterior chest wall and clear at posterior chest wall. Adventitious sounds - No Adventitious sounds.  Cardiovascular Auscultation Rhythm - Regular rate and rhythm. Heart Sounds - S1 WNL and S2 WNL. Murmurs & Other Heart Sounds - Auscultation of the heart reveals - No Murmurs.  Abdomen Palpation/Percussion Tenderness - Abdomen is non-tender to palpation. Rigidity (guarding) - Abdomen is soft. Auscultation Auscultation of the abdomen reveals - Bowel sounds normal.  Female Genitourinary Note: Not done, not pertinent to present illness   Musculoskeletal Note: On exam, she is alert and oriented, in no apparent distress. Her right knee shows moderate effusion. There is no warmth about the knee. The range about 0 to 115. She is very tender laterally, but no medial tenderness or instability noted.  RADIOGRAPHS Radiographs are pretty impressive in that she has now gone to complete bone-on-bone in the lateral compartment and gone into about 10 degrees of valgus. This is a significant change from preop.  Assessment & Plan Primary osteoarthritis of right knee (M17.11) Primary osteoarthritis of left knee (M17.12) Note:Surgical Plans: Right Total Knee Replacement  Disposition: Home  PCP: Dr. Marianna Payment - Patient has been seen preoperatively and felt to be stable for surgery.  IV TXA  Anesthesia Issues: None  Signed electronically by Joelene Millin, III PA-C

## 2015-07-14 NOTE — Transfer of Care (Signed)
Immediate Anesthesia Transfer of Care Note  Patient: Sharon Gordon  Procedure(s) Performed: Procedure(s): TOTAL RIGHT KNEE ARTHROPLASTY (Right)  Patient Location: PACU  Anesthesia Type:General  Level of Consciousness:  sedated, patient cooperative and responds to stimulation  Airway & Oxygen Therapy:Patient Spontanous Breathing and Patient connected to face mask oxgen  Post-op Assessment:  Report given to PACU RN and Post -op Vital signs reviewed and stable  Post vital signs:  Reviewed and stable  Last Vitals:  Filed Vitals:   07/14/15 1255  BP: 138/65  Pulse: 82  Temp: 36.6 C  Resp: 18    Complications: No apparent anesthesia complications

## 2015-07-14 NOTE — Interval H&P Note (Signed)
History and Physical Interval Note:  07/14/2015 3:54 PM  Sharon Gordon  has presented today for surgery, with the diagnosis of OA RIGHT KNEE   The various methods of treatment have been discussed with the patient and family. After consideration of risks, benefits and other options for treatment, the patient has consented to  Procedure(s): TOTAL RIGHT KNEE ARTHROPLASTY (Right) as a surgical intervention .  The patient's history has been reviewed, patient examined, no change in status, stable for surgery.  I have reviewed the patient's chart and labs.  Questions were answered to the patient's satisfaction.     Gearlean Alf

## 2015-07-14 NOTE — Op Note (Signed)
Pre-operative diagnosis- Osteoarthritis  Right knee(s)  Post-operative diagnosis- Osteoarthritis Right knee(s)  Procedure-  Right  Total Knee Arthroplasty  Surgeon- Sharon Plover. Heaton Sarin, MD  Assistant- Arlee Muslim, PA-C   Anesthesia-  General  EBL-* No blood loss amount entered *   Drains Hemovac  Tourniquet time-  Total Tourniquet Time Documented: Thigh (Right) - 30 minutes Total: Thigh (Right) - 30 minutes     Complications- None  Condition-PACU - hemodynamically stable.   Brief Clinical Note  Sharon Gordon is a 81 y.o. year old female with end stage OA of her right knee with rapidly progressive worsening pain and dysfunction. She has constant pain, with activity and at rest and significant functional deficits with difficulties even with ADLs. She has had extensive non-op management including analgesics, injections of cortisone, and home exercise program, but remains in significant pain with significant dysfunction.Radiographs show progressive bone on bone arthritis in the lateral compartment. She presents now for right Total Knee Arthroplasty.    Procedure in detail---   The patient is brought into the operating room and positioned supine on the operating table. After successful administration of  General,   a tourniquet is placed high on the  Right thigh(s) and the lower extremity is prepped and draped in the usual sterile fashion. Time out is performed by the operating team and then the  Right lower extremity is wrapped in Esmarch, knee flexed and the tourniquet inflated to 300 mmHg.       A midline incision is made with a ten blade through the subcutaneous tissue to the level of the extensor mechanism. A fresh blade is used to make a medial parapatellar arthrotomy. Soft tissue over the proximal medial tibia is subperiosteally elevated to the joint line with a knife and into the semimembranosus bursa with a Cobb elevator. Soft tissue over the proximal lateral tibia is elevated with  attention being paid to avoiding the patellar tendon on the tibial tubercle. The patella is everted, knee flexed 90 degrees and the ACL and PCL are removed. Findings are bone on bone lateral compartment with large avascular defect posterior lateral femoral condyle.        The drill is used to create a starting hole in the distal femur and the canal is thoroughly irrigated with sterile saline to remove the fatty contents. The 5 degree Right  valgus alignment guide is placed into the femoral canal and the distal femoral cutting block is pinned to remove 10 mm off the distal femur. Resection is made with an oscillating saw.      The tibia is subluxed forward and the menisci are removed. The extramedullary alignment guide is placed referencing proximally at the medial aspect of the tibial tubercle and distally along the second metatarsal axis and tibial crest. The block is pinned to remove 32mm off the more deficient lateral  side. Resection is made with an oscillating saw. Size 3is the most appropriate size for the tibia and the proximal tibia is prepared with the modular drill and keel punch for that size.      The femoral sizing guide is placed and size 3 is most appropriate. Rotation is marked off the epicondylar axis and confirmed by creating a rectangular flexion gap at 90 degrees. The size 3 cutting block is pinned in this rotation and the anterior, posterior and chamfer cuts are made with the oscillating saw. The cuts were made back to healthy bone. The intercondylar block is then placed and that cut is made.  Trial size 3 tibial component, trial size 3 posterior stabilized femur and a 12.5  mm posterior stabilized rotating platform insert trial is placed. Full extension is achieved with excellent varus/valgus and anterior/posterior balance throughout full range of motion. The patella is everted and thickness measured to be 22  mm. Free hand resection is taken to 12 mm, a 38 template is placed, lug holes  are drilled, trial patella is placed, and it tracks normally. Osteophytes are removed off the posterior femur with the trial in place. All trials are removed and the cut bone surfaces prepared with pulsatile lavage. Cement is mixed and once ready for implantation, the size 2.5 tibial implant, size  2.5 posterior stabilized femoral component, and the size 38 patella are cemented in place and the patella is held with the clamp. The trial insert is placed and the knee held in full extension. The Exparel (20 ml mixed with 30 ml saline) and .25% Bupivicaine, are injected into the extensor mechanism, posterior capsule, medial and lateral gutters and subcutaneous tissues.  All extruded cement is removed and once the cement is hard the permanent 12.5 mm posterior stabilized rotating platform insert is placed into the tibial tray.      The wound is copiously irrigated with saline solution and the extensor mechanism closed over a hemovac drain with #1 V-loc suture. The tourniquet is released for a total tourniquet time of 30  minutes. Flexion against gravity is 140 degrees and the patella tracks normally. Subcutaneous tissue is closed with 2.0 vicryl and subcuticular with running 4.0 Monocryl. The incision is cleaned and dried and steri-strips and a bulky sterile dressing are applied. The limb is placed into a knee immobilizer and the patient is awakened and transported to recovery in stable condition.      Please note that a surgical assistant was a medical necessity for this procedure in order to perform it in a safe and expeditious manner. Surgical assistant was necessary to retract the ligaments and vital neurovascular structures to prevent injury to them and also necessary for proper positioning of the limb to allow for anatomic placement of the prosthesis.   Sharon Plover Khris Jansson, MD    07/14/2015, 5:52 PM

## 2015-07-14 NOTE — Progress Notes (Signed)
Continue giving fentanyl in pacu another 170mcg. -per Dr Deirdre Priest

## 2015-07-14 NOTE — Anesthesia Preprocedure Evaluation (Addendum)
Anesthesia Evaluation  Patient identified by MRN, date of birth, ID band Patient awake    Reviewed: Allergy & Precautions, NPO status , Patient's Chart, lab work & pertinent test results  Airway Mallampati: II  TM Distance: >3 FB Neck ROM: Full    Dental no notable dental hx.    Pulmonary neg pulmonary ROS,    Pulmonary exam normal breath sounds clear to auscultation       Cardiovascular Exercise Tolerance: Good hypertension, Pt. on medications Normal cardiovascular exam Rhythm:Regular Rate:Normal     Neuro/Psych negative neurological ROS  negative psych ROS   GI/Hepatic negative GI ROS, Neg liver ROS,   Endo/Other  Hypothyroidism   Renal/GU Renal InsufficiencyRenal diseaseCr 1.48 K 5.5  negative genitourinary   Musculoskeletal  (+) Arthritis ,   Abdominal   Peds negative pediatric ROS (+)  Hematology negative hematology ROS (+)   Anesthesia Other Findings   Reproductive/Obstetrics negative OB ROS                          Anesthesia Physical Anesthesia Plan  ASA: III  Anesthesia Plan: General   Post-op Pain Management:    Induction: Intravenous  Airway Management Planned: Oral ETT  Additional Equipment:   Intra-op Plan:   Post-operative Plan: Extubation in OR  Informed Consent: I have reviewed the patients History and Physical, chart, labs and discussed the procedure including the risks, benefits and alternatives for the proposed anesthesia with the patient or authorized representative who has indicated his/her understanding and acceptance.   Dental advisory given  Plan Discussed with: CRNA  Anesthesia Plan Comments: (Discussed r/b of general and spinal. She had a lumbar fusion at [facility] in 2010; she does not know the level of her fusion and xrays are not available. She prefers general anesthesia today.)      Anesthesia Quick Evaluation

## 2015-07-14 NOTE — Anesthesia Procedure Notes (Signed)
Procedure Name: Intubation Date/Time: 07/14/2015 4:47 PM Performed by: Danley Danker L Patient Re-evaluated:Patient Re-evaluated prior to inductionOxygen Delivery Method: Circle system utilized Preoxygenation: Pre-oxygenation with 100% oxygen Intubation Type: IV induction Ventilation: Mask ventilation without difficulty and Oral airway inserted - appropriate to patient size Laryngoscope Size: Sabra Heck and 2 Grade View: Grade II Tube type: Oral Tube size: 7.0 mm Number of attempts: 1 Airway Equipment and Method: Stylet Placement Confirmation: ETT inserted through vocal cords under direct vision,  breath sounds checked- equal and bilateral and positive ETCO2 Secured at: 22 cm Tube secured with: Tape Dental Injury: Teeth and Oropharynx as per pre-operative assessment

## 2015-07-15 ENCOUNTER — Encounter (HOSPITAL_COMMUNITY): Payer: Self-pay | Admitting: Orthopedic Surgery

## 2015-07-15 LAB — CBC
HCT: 30.8 % — ABNORMAL LOW (ref 36.0–46.0)
HEMOGLOBIN: 10.1 g/dL — AB (ref 12.0–15.0)
MCH: 32.6 pg (ref 26.0–34.0)
MCHC: 32.8 g/dL (ref 30.0–36.0)
MCV: 99.4 fL (ref 78.0–100.0)
Platelets: 352 10*3/uL (ref 150–400)
RBC: 3.1 MIL/uL — ABNORMAL LOW (ref 3.87–5.11)
RDW: 14.9 % (ref 11.5–15.5)
WBC: 8.1 10*3/uL (ref 4.0–10.5)

## 2015-07-15 LAB — BASIC METABOLIC PANEL
ANION GAP: 6 (ref 5–15)
BUN: 25 mg/dL — ABNORMAL HIGH (ref 6–20)
CALCIUM: 8.8 mg/dL — AB (ref 8.9–10.3)
CO2: 27 mmol/L (ref 22–32)
CREATININE: 1.11 mg/dL — AB (ref 0.44–1.00)
Chloride: 106 mmol/L (ref 101–111)
GFR calc non Af Amer: 44 mL/min — ABNORMAL LOW (ref >60)
GFR, EST AFRICAN AMERICAN: 51 mL/min — AB (ref >60)
Glucose, Bld: 146 mg/dL — ABNORMAL HIGH (ref 65–99)
Potassium: 4.9 mmol/L (ref 3.5–5.1)
SODIUM: 139 mmol/L (ref 135–145)

## 2015-07-15 MED ORDER — TRAMADOL HCL 50 MG PO TABS: 50.0000 mg | ORAL_TABLET | Freq: Four times a day (QID) | ORAL | Status: DC | PRN

## 2015-07-15 MED ORDER — LORAZEPAM 0.5 MG PO TABS
0.5000 mg | ORAL_TABLET | Freq: Once | ORAL | Status: AC
  Administered 2015-07-15: 0.5 mg via ORAL
  Filled 2015-07-15: qty 1

## 2015-07-15 MED ORDER — APIXABAN 2.5 MG PO TABS: 2.5000 mg | ORAL_TABLET | Freq: Two times a day (BID) | ORAL | Status: DC

## 2015-07-15 MED ORDER — OXYCODONE HCL 5 MG PO TABS: 5.0000 mg | ORAL_TABLET | ORAL | Status: DC | PRN

## 2015-07-15 MED ORDER — METHOCARBAMOL 500 MG PO TABS: 500.0000 mg | ORAL_TABLET | Freq: Four times a day (QID) | ORAL | Status: DC | PRN

## 2015-07-15 NOTE — Evaluation (Signed)
Physical Therapy Evaluation Patient Details Name: Xaniyah Buchholz MRN: [id number] DOB: April 24, 1928 Today's Date: 07/15/2015   History of Present Illness  81 yo female s/p R TKA 07/14/15. Hx of back surgery  Clinical Impression  On eval, pt required Min assist for mobility-walked ~50 feet with RW. Pain rated 6-7/10 with activity.     Follow Up Recommendations Home health PT    Equipment Recommendations  Rolling walker with 5" wheels    Recommendations for Other Services       Precautions / Restrictions Precautions Precautions: Knee Required Braces or Orthoses: Knee Immobilizer - Right Knee Immobilizer - Right: Discontinue once straight leg raise with < 10 degree lag Restrictions Weight Bearing Restrictions: No RLE Weight Bearing: Weight bearing as tolerated      Mobility  Bed Mobility Overal bed mobility: Needs Assistance Bed Mobility: Sit to Supine       Sit to supine: Min assist   General bed mobility comments: assist for R LE  Transfers Overall transfer level: Needs assistance Equipment used: Rolling walker (2 wheeled) Transfers: Sit to/from Stand Sit to Stand: Min guard         General transfer comment: close guard for safety. VCs safety, hand/LE placement  Ambulation/Gait Ambulation/Gait assistance: Min guard Ambulation Distance (Feet): 50 Feet Assistive device: Rolling walker (2 wheeled) Gait Pattern/deviations: Step-to pattern;Trunk flexed;Antalgic     General Gait Details: close guard for safety. VCs safety, technique, sequence. slow, steady pace.   Stairs            Wheelchair Mobility    Modified Rankin (Stroke Patients Only)       Balance                                             Pertinent Vitals/Pain Pain Assessment: 0-10 Pain Score: 7  Pain Location: R knee with activity Pain Descriptors / Indicators: Aching;Sore Pain Intervention(s): Monitored during session;Repositioned;Ice applied    Home Living  Family/patient expects to be discharged to:: Private residence Living Arrangements: Spouse/significant other;Children Available Help at Discharge: Family Type of Home: House Home Access: Stairs to enter (family building a ramp currently)   Technical brewer of Steps: 1 Home Layout: One level Home Equipment: Walker - standard;Wheelchair - manual Additional Comments: borrowed a wheelchair. pt states her 3in1 is very old and needs a new one.    Prior Function Level of Independence: Needs assistance   Gait / Transfers Assistance Needed: pt has been using a wheelchair to get around the last 3 weeks.  ADL's / Homemaking Assistance Needed: pt has had assist with LB dressing.        Hand Dominance        Extremity/Trunk Assessment   Upper Extremity Assessment: Defer to OT evaluation           Lower Extremity Assessment: RLE deficits/detail RLE Deficits / Details: SLR 3/5, moves ankle well    Cervical / Trunk Assessment: Normal  Communication   Communication: No difficulties  Cognition Arousal/Alertness: Awake/alert Behavior During Therapy: WFL for tasks assessed/performed Overall Cognitive Status: Within Functional Limits for tasks assessed                      General Comments      Exercises Total Joint Exercises Ankle Circles/Pumps: AROM;Both;10 reps;Supine Quad Sets: AROM;Both;10 reps;Supine Heel Slides: AAROM;Right;10 reps;Supine Hip ABduction/ADduction: AAROM;Right;10 reps;Supine Straight  Leg Raises: AROM;Right;10 reps;Supine Goniometric ROM: ~10-50 degrees      Assessment/Plan    PT Assessment Patient needs continued PT services  PT Diagnosis Difficulty walking;Acute pain   PT Problem List Decreased strength;Decreased range of motion;Decreased activity tolerance;Decreased balance;Decreased mobility;Pain  PT Treatment Interventions Gait training;DME instruction;Stair training;Functional mobility training;Therapeutic activities;Patient/family  education;Therapeutic exercise;Balance training   PT Goals (Current goals can be found in the Care Plan section) Acute Rehab PT Goals Patient Stated Goal: to be able to walk PT Goal Formulation: With patient/family Time For Goal Achievement: 07/22/15 Potential to Achieve Goals: Good    Frequency 7X/week   Barriers to discharge        Co-evaluation               End of Session Equipment Utilized During Treatment: Gait belt;Right knee immobilizer Activity Tolerance: Patient tolerated treatment well Patient left: in bed;with call bell/phone within reach;with family/visitor present           Time: 1207-1227 PT Time Calculation (min) (ACUTE ONLY): 20 min   Charges:   PT Evaluation $Initial PT Evaluation Tier I: 1 Procedure     PT G Codes:        Weston Anna, MPT Pager: [phone]

## 2015-07-15 NOTE — Progress Notes (Addendum)
   Subjective: 1 Day Post-Op Procedure(s) (LRB): TOTAL RIGHT KNEE ARTHROPLASTY (Right) Patient reports pain as mild.   Patient seen in rounds for Dr. Wynelle Link. Patient is well, but has had some minor complaints of pain in the knee, requiring pain medications We will start therapy today.  She already sat up on the side of the bed this morning. Plan is to go Home after hospital stay.  Objective: Vital signs in last 24 hours: Temp:  [96.8 F (36 C)-98.5 F (36.9 C)] 97.8 F (36.6 C) (10/11 0625) Pulse Rate:  [75-87] 80 (10/11 0625) Resp:  [9-18] 14 (10/11 0625) BP: (95-177)/(48-103) 110/57 mmHg (10/11 0625) SpO2:  [93 %-100 %] 99 % (10/11 0625) Weight:  [61.406 kg (135 lb 6 oz)] 61.406 kg (135 lb 6 oz) (10/10 1343)  Intake/Output from previous day:  Intake/Output Summary (Last 24 hours) at 07/15/15 0710 Last data filed at 07/15/15 0626  Gross per 24 hour  Intake 3255.25 ml  Output   1165 ml  Net 2090.25 ml    Intake/Output this shift: UOP 800 since around [place]  Labs:  Recent Labs  07/15/15 0520  HGB 10.1*    Recent Labs  07/15/15 0520  WBC 8.1  RBC 3.10*  HCT 30.8*  PLT 352    Recent Labs  07/15/15 0520  NA 139  K 4.9  CL 106  CO2 27  BUN 25*  CREATININE 1.11*  GLUCOSE 146*  CALCIUM 8.8*   No results for input(s): LABPT, INR in the last 72 hours.  EXAM General - Patient is Alert, Appropriate and Oriented Extremity - Neurovascular intact Sensation intact distally Dressing - dressing C/D/I Motor Function - intact, moving foot and toes well on exam.  Hemovac pulled without difficulty.  Past Medical History  Diagnosis Date  . Hypertension   . Hypothyroidism   . Lateral meniscal tear     right  . Vein symptom     patients reports vein behind left leg at knee hurting at times for last week  . Arthritis     Assessment/Plan: 1 Day Post-Op Procedure(s) (LRB): TOTAL RIGHT KNEE ARTHROPLASTY (Right) Principal Problem:   OA (osteoarthritis) of  knee  Estimated body mass index is 23.23 kg/(m^2) as calculated from the following:   Height as of this encounter: 5\' 4"  (1.626 m).   Weight as of this encounter: 61.406 kg (135 lb 6 oz). Advance diet Up with therapy Plan for discharge tomorrow Discharge home with home health  DVT Prophylaxis - Eliquis Weight-Bearing as tolerated to right leg D/C O2 and Pulse OX and try on Room Air  Arlee Muslim, PA-C Orthopaedic Surgery 07/15/2015, 7:10 AM

## 2015-07-15 NOTE — Progress Notes (Signed)
Physical Therapy Treatment Patient Details Name: Sharon Gordon MRN: [id number] DOB: 1928-07-31 Today's Date: 07/15/2015    History of Present Illness 81 yo female s/p R TKA 07/14/15. Hx of back surgery    PT Comments    Increased pain this session. Pain rated 7/10 with activity.   Follow Up Recommendations  Home health PT     Equipment Recommendations  Rolling walker with 5" wheels    Recommendations for Other Services       Precautions / Restrictions Precautions Precautions: Knee Required Braces or Orthoses: Knee Immobilizer - Right Knee Immobilizer - Right: Discontinue once straight leg raise with < 10 degree lag Restrictions Weight Bearing Restrictions: No RLE Weight Bearing: Weight bearing as tolerated    Mobility  Bed Mobility Overal bed mobility: Needs Assistance Bed Mobility: Supine to Sit;Sit to Supine     Supine to sit: Min assist Sit to supine: Min assist   General bed mobility comments: assist for R LE  Transfers Overall transfer level: Needs assistance Equipment used: Rolling walker (2 wheeled) Transfers: Sit to/from Stand Sit to Stand: Min guard         General transfer comment: close guard for safety. VCs safety, hand/LE placement  Ambulation/Gait Ambulation/Gait assistance: Min guard Ambulation Distance (Feet): 80 Feet Assistive device: Rolling walker (2 wheeled) Gait Pattern/deviations: Step-to pattern;Antalgic     General Gait Details: close guard for safety. VCs safety, technique, sequence. slow, steady pace.    Stairs            Wheelchair Mobility    Modified Rankin (Stroke Patients Only)       Balance                                    Cognition Arousal/Alertness: Awake/alert Behavior During Therapy: WFL for tasks assessed/performed Overall Cognitive Status: Within Functional Limits for tasks assessed                      Exercises     General Comments        Pertinent  Vitals/Pain Pain Assessment: 0-10 Pain Score: 7  Pain Location: R knee with activity Pain Descriptors / Indicators: Aching;Sore Pain Intervention(s): Monitored during session;Ice applied;Repositioned    Home Living Family/patient expects to be discharged to:: Private residence Living Arrangements: Spouse/significant other;Children Available Help at Discharge: Family Type of Home: House Home Access: Stairs to enter (family building a ramp currently)   Home Layout: One level Home Equipment: Walker - standard;Wheelchair - manual Additional Comments: borrowed a wheelchair. pt states her 3in1 is very old and needs a new one.    Prior Function Level of Independence: Needs assistance  Gait / Transfers Assistance Needed: pt has been using a wheelchair to get around the last 3 weeks. ADL's / Homemaking Assistance Needed: pt has had assist with LB dressing.     PT Goals (current goals can now be found in the care plan section) Acute Rehab PT Goals Patient Stated Goal: to be able to walk PT Goal Formulation: With patient/family Time For Goal Achievement: 07/22/15 Potential to Achieve Goals: Good Progress towards PT goals: Progressing toward goals    Frequency  7X/week    PT Plan Current plan remains appropriate    Co-evaluation             End of Session Equipment Utilized During Treatment: Gait belt;Right knee immobilizer Activity Tolerance: Patient tolerated treatment  well Patient left: in bed;with call bell/phone within reach;with bed alarm set     Time: 1455-1515 PT Time Calculation (min) (ACUTE ONLY): 20 min  Charges:  $Gait Training: 8-22 mins                    G Codes:      Weston Anna, MPT Pager: [phone]

## 2015-07-15 NOTE — Care Management Note (Signed)
Case Management Note  Patient Details  Name: Sharon Gordon MRN: [id number] Date of Birth: 09-27-1928  Subjective/Objective:                   TOTAL RIGHT KNEE ARTHROPLASTY (Right) Action/Plan:  Discharge planning Expected Discharge Date:  07/16/15               Expected Discharge Plan:  [place]  In-House Referral:     Discharge planning Services  CM Consult  Post Acute Care Choice:  Home Health Choice offered to:     DME Arranged:  3-N-1, Walker rolling DME Agency:  [place]:  PT [facility] Agency:  [place]  Status of Service:  Completed, signed off  Medicare Important Message Given:    Date Medicare IM Given:    Medicare IM give by:    Date Additional Medicare IM Given:    Additional Medicare Important Message give by:     If discussed at [place] of Stay Meetings, dates discussed:    Additional Comments: CM met with pt in room to offer choice of home health agency.  Pt chooses [facility] to render HHPT.  Address and contact information verified by pt.  Referral emailed to [employer], Tim.  CM called [facility] DME rep, Lecretia to please deliver the rolling walker and 3n1 to room prior to discharge.  No other CM needs were communicated. Dellie Catholic, RN 07/15/2015, 3:37 PM

## 2015-07-15 NOTE — Anesthesia Postprocedure Evaluation (Signed)
  Anesthesia Post-op Note  Patient: Sharon Gordon  Procedure(s) Performed: Procedure(s) (LRB): TOTAL RIGHT KNEE ARTHROPLASTY (Right)  Patient Location: PACU  Anesthesia Type: General  Level of Consciousness: awake and alert   Airway and Oxygen Therapy: Patient Spontanous Breathing  Post-op Pain: mild  Post-op Assessment: Post-op Vital signs reviewed, Patient's Cardiovascular Status Stable, Respiratory Function Stable, Patent Airway and No signs of Nausea or vomiting  Last Vitals:  Filed Vitals:   07/15/15 0243  BP: 95/57  Pulse: 82  Temp: 36.2 C  Resp: 14    Post-op Vital Signs: stable   Complications: No apparent anesthesia complications

## 2015-07-15 NOTE — Progress Notes (Signed)
Utilization review completed.  

## 2015-07-15 NOTE — Discharge Instructions (Addendum)
° °Dr. Frank Aluisio °Total Joint Specialist °[facility] °[address] °[place], [place] [address] °[phone] ° °TOTAL KNEE REPLACEMENT POSTOPERATIVE DIRECTIONS ° °Knee Rehabilitation, Guidelines Following Surgery  °Results after knee surgery are often greatly improved when you follow the exercise, range of motion and muscle strengthening exercises prescribed by your doctor. Safety measures are also important to protect the knee from further injury. Any time any of these exercises cause you to have increased pain or swelling in your knee joint, decrease the amount until you are comfortable again and slowly increase them. If you have problems or questions, call your caregiver or physical therapist for advice.  ° °HOME CARE INSTRUCTIONS  °Remove items at home which could result in a fall. This includes throw rugs or furniture in walking pathways.  °· ICE to the affected knee every three hours for 30 minutes at a time and then as needed for pain and swelling.  Continue to use ice on the knee for pain and swelling from surgery. You may notice swelling that will progress down to the foot and ankle.  This is normal after surgery.  Elevate the leg when you are not up walking on it.   °· Continue to use the breathing machine which will help keep your temperature down.  It is common for your temperature to cycle up and down following surgery, especially at night when you are not up moving around and exerting yourself.  The breathing machine keeps your lungs expanded and your temperature down. °· Do not place pillow under knee, focus on keeping the knee straight while resting ° °DIET °You may resume your previous home diet once your are discharged from the hospital. ° °DRESSING / WOUND CARE / SHOWERING °You may shower 3 days after surgery, but keep the wounds dry during showering.  You may use an occlusive plastic wrap (Press'n Seal for example), NO SOAKING/SUBMERGING IN THE BATHTUB.  If the  bandage gets wet, change with a clean dry gauze.  If the incision gets wet, pat the wound dry with a clean towel. °You may start showering once you are discharged home but do not submerge the incision under water. Just pat the incision dry and apply a dry gauze dressing on daily. °Change the surgical dressing daily and reapply a dry dressing each time. ° °ACTIVITY °Walk with your walker as instructed. °Use walker as long as suggested by your caregivers. °Avoid periods of inactivity such as sitting longer than an hour when not asleep. This helps prevent blood clots.  °You may resume a sexual relationship in one month or when given the OK by your doctor.  °You may return to work once you are cleared by your doctor.  °Do not drive a car for 6 weeks or until released by you surgeon.  °Do not drive while taking narcotics. ° °WEIGHT BEARING °Weight bearing as tolerated with assist device (walker, cane, etc) as directed, use it as long as suggested by your surgeon or therapist, typically at least 4-6 weeks. ° °POSTOPERATIVE CONSTIPATION PROTOCOL °Constipation - defined medically as fewer than three stools per week and severe constipation as less than one stool per week. ° °One of the most common issues patients have following surgery is constipation.  Even if you have a regular bowel pattern at home, your normal regimen is likely to be disrupted due to multiple reasons following surgery.  Combination of anesthesia, postoperative narcotics, change in appetite and fluid intake all can affect your bowels.    In order to avoid complications following surgery, here are some recommendations in order to help you during your recovery period. ° °Colace (docusate) - Pick up an over-the-counter form of Colace or another stool softener and take twice a day as long as you are requiring postoperative pain medications.  Take with a full glass of water daily.  If you experience loose stools or diarrhea, hold the colace until you stool forms  back up.  If your symptoms do not get better within 1 week or if they get worse, check with your doctor. ° °Dulcolax (bisacodyl) - Pick up over-the-counter and take as directed by the product packaging as needed to assist with the movement of your bowels.  Take with a full glass of water.  Use this product as needed if not relieved by Colace only.  ° °MiraLax (polyethylene glycol) - Pick up over-the-counter to have on hand.  MiraLax is a solution that will increase the amount of water in your bowels to assist with bowel movements.  Take as directed and can mix with a glass of water, juice, soda, coffee, or tea.  Take if you go more than two days without a movement. °Do not use MiraLax more than once per day. Call your doctor if you are still constipated or irregular after using this medication for 7 days in a row. ° °If you continue to have problems with postoperative constipation, please contact the office for further assistance and recommendations.  If you experience "the worst abdominal pain ever" or develop nausea or vomiting, please contact the office immediatly for further recommendations for treatment. ° °ITCHING ° If you experience itching with your medications, try taking only a single pain pill, or even half a pain pill at a time.  You can also use Benadryl over the counter for itching or also to help with sleep.  ° °TED HOSE STOCKINGS °Wear the elastic stockings on both legs for three weeks following surgery during the day but you may remove then at night for sleeping. ° °MEDICATIONS °See your medication summary on the “After Visit Summary” that the nursing staff will review with you prior to discharge.  You may have some home medications which will be placed on hold until you complete the course of blood thinner medication.  It is important for you to complete the blood thinner medication as prescribed by your surgeon.  Continue your approved medications as instructed at time of  discharge. ° °PRECAUTIONS °If you experience chest pain or shortness of breath - call 911 immediately for transfer to the hospital emergency department.  °If you develop a fever greater that 101 F, purulent drainage from wound, increased redness or drainage from wound, foul odor from the wound/dressing, or calf pain - CONTACT YOUR SURGEON.   °                                                °FOLLOW-UP APPOINTMENTS °Make sure you keep all of your appointments after your operation with your surgeon and caregivers. You should call the office at the above phone number and make an appointment for approximately two weeks after the date of your surgery or on the date instructed by your surgeon outlined in the "After Visit Summary". ° ° °RANGE OF MOTION AND STRENGTHENING EXERCISES  °Rehabilitation of the knee is important following a knee injury or   an operation. After just a few days of immobilization, the muscles of the thigh which control the knee become weakened and shrink (atrophy). Knee exercises are designed to build up the tone and strength of the thigh muscles and to improve knee motion. Often times heat used for twenty to thirty minutes before working out will loosen up your tissues and help with improving the range of motion but do not use heat for the first two weeks following surgery. These exercises can be done on a training (exercise) mat, on the floor, on a table or on a bed. Use what ever works the best and is most comfortable for you Knee exercises include:  °Leg Lifts - While your knee is still immobilized in a splint or cast, you can do straight leg raises. Lift the leg to 60 degrees, hold for 3 sec, and slowly lower the leg. Repeat 10-20 times 2-3 times daily. Perform this exercise against resistance later as your knee gets better.  °Quad and Hamstring Sets - Tighten up the muscle on the front of the thigh (Quad) and hold for 5-10 sec. Repeat this 10-20 times hourly. Hamstring sets are done by pushing the  foot backward against an object and holding for 5-10 sec. Repeat as with quad sets.  °· Leg Slides: Lying on your back, slowly slide your foot toward your buttocks, bending your knee up off the floor (only go as far as is comfortable). Then slowly slide your foot back down until your leg is flat on the floor again. °· Angel Wings: Lying on your back spread your legs to the side as far apart as you can without causing discomfort.  °A rehabilitation program following serious knee injuries can speed recovery and prevent re-injury in the future due to weakened muscles. Contact your doctor or a physical therapist for more information on knee rehabilitation.  ° °IF YOU ARE TRANSFERRED TO A SKILLED REHAB FACILITY °If the patient is transferred to a skilled rehab facility following release from the hospital, a list of the current medications will be sent to the facility for the patient to continue.  When discharged from the skilled rehab facility, please have the facility set up the patient's Home Health Physical Therapy prior to being released. Also, the skilled facility will be responsible for providing the patient with their medications at time of release from the facility to include their pain medication, the muscle relaxants, and their blood thinner medication. If the patient is still at the rehab facility at time of the two week follow up appointment, the skilled rehab facility will also need to assist the patient in arranging follow up appointment in our office and any transportation needs. ° °MAKE SURE YOU:  °Understand these instructions.  °Get help right away if you are not doing well or get worse.  ° ° °Pick up stool softner and laxative for home use following surgery while on pain medications. °Do not submerge incision under water. °Please use good hand washing techniques while changing dressing each day. °May shower starting three days after surgery. °Please use a clean towel to pat the incision dry following  showers. °Continue to use ice for pain and swelling after surgery. °Do not use any lotions or creams on the incision until instructed by your surgeon. ° °Take Eliquis twice a day for two and a half more weeks, then discontinue Eliquis. °Once the patient has completed the blood thinner regimen, then take a Baby 81 mg Aspirin daily for three more weeks. ° ° °  Information on my medicine - ELIQUIS (apixaban)  This medication education was reviewed with me or my healthcare representative as part of my discharge preparation.  The pharmacist that spoke with me during my hospital stay was:  Luiz Ochoa [facility]  Why was Eliquis prescribed for you? Eliquis was prescribed for you to reduce the risk of blood clots forming after orthopedic surgery.    What do You need to know about Eliquis? Take your Eliquis TWICE DAILY - one tablet in the morning and one tablet in the evening with or without food.  It would be best to take the dose about the same time each day.  If you have difficulty swallowing the tablet whole please discuss with your pharmacist how to take the medication safely.  Take Eliquis exactly as prescribed by your doctor and DO NOT stop taking Eliquis without talking to the doctor who prescribed the medication.  Stopping without other medication to take the place of Eliquis may increase your risk of developing a clot.  After discharge, you should have regular check-up appointments with your healthcare provider that is prescribing your Eliquis.  What do you do if you miss a dose? If a dose of ELIQUIS is not taken at the scheduled time, take it as soon as possible on the same day and twice-daily administration should be resumed.  The dose should not be doubled to make up for a missed dose.  Do not take more than one tablet of ELIQUIS at the same time.  Important Safety Information A possible side effect of Eliquis is bleeding. You should call your healthcare provider right away if you  experience any of the following: ? Bleeding from an injury or your nose that does not stop. ? Unusual colored urine (red or dark brown) or unusual colored stools (red or black). ? Unusual bruising for unknown reasons. ? A serious fall or if you hit your head (even if there is no bleeding).  Some medicines may interact with Eliquis and might increase your risk of bleeding or clotting while on Eliquis. To help avoid this, consult your healthcare provider or pharmacist prior to using any new prescription or non-prescription medications, including herbals, vitamins, non-steroidal anti-inflammatory drugs (NSAIDs) and supplements.  This website has more information on Eliquis (apixaban): http://[email or web address].com/eliquis/home

## 2015-07-15 NOTE — Evaluation (Signed)
Occupational Therapy Evaluation Patient Details Name: Sharon Gordon MRN: [id number] DOB: Jul 09, 1928 Today's Date: 07/15/2015    History of Present Illness Pt is s/p R TKA. History of back surgery.   Clinical Impression   Pt doing well. Up to 3in1 with walker and min assist. Pt has daughter and husband helping at d/c. Will follow on acute to progress ADL independence.    Follow Up Recommendations  No OT follow up;Supervision/Assistance - 24 hour    Equipment Recommendations  3 in 1 bedside comode;Other (comment) (RW (OT))    Recommendations for Other Services       Precautions / Restrictions Precautions Precautions: Knee Required Braces or Orthoses: Knee Immobilizer - Right Knee Immobilizer - Right: Discontinue once straight leg raise with < 10 degree lag Restrictions Weight Bearing Restrictions: No      Mobility Bed Mobility               General bed mobility comments: pt in chair  Transfers Overall transfer level: Needs assistance Equipment used: Rolling walker (2 wheeled) Transfers: Sit to/from Stand Sit to Stand: Min assist         General transfer comment: cues for hand placement and LE management.    Balance                                            ADL Overall ADL's : Needs assistance/impaired Eating/Feeding: Independent;Sitting   Grooming: Wash/dry hands;Set up;Sitting   Upper Body Bathing: Set up;Sitting   Lower Body Bathing: Minimal assistance;Sit to/from stand   Upper Body Dressing : Set up;Sitting   Lower Body Dressing: Moderate assistance;Sit to/from stand   Toilet Transfer: Minimal assistance;Ambulation;BSC;RW   Toileting- Clothing Manipulation and Hygiene: Minimal assistance;Sit to/from stand         General ADL Comments: Discussed LB dressing and AE options. Pt states she will think about whether she is interested in AE versus having family assist. Pt needed min cues for safe use of walker including  proper walker distance to self.  Discussed use of 3in1 as shower chair also.      Vision     Perception     Praxis      Pertinent Vitals/Pain Pain Assessment: 0-10 Pain Score: 5  Pain Location: R knee Pain Descriptors / Indicators: Aching Pain Intervention(s): Repositioned;Ice applied     Hand Dominance     Extremity/Trunk Assessment Upper Extremity Assessment Upper Extremity Assessment: Overall WFL for tasks assessed           Communication Communication Communication: No difficulties   Cognition Arousal/Alertness: Awake/alert Behavior During Therapy: WFL for tasks assessed/performed Overall Cognitive Status: Within Functional Limits for tasks assessed                     General Comments       Exercises       Shoulder Instructions      Home Living Family/patient expects to be discharged to:: Private residence Living Arrangements: Spouse/significant other;Children Available Help at Discharge: Family Type of Home: House Home Access: Stairs to enter (family building a ramp currently) Technical brewer of Steps: 1   Home Layout: One level     Bathroom Shower/Tub: Occupational psychologist: Handicapped height     Home Equipment: Environmental consultant - standard;Wheelchair - manual   Additional Comments: borrowed a wheelchair. pt states her 3in1  is very old and needs a new one.      Prior Functioning/Environment Level of Independence: Needs assistance  Gait / Transfers Assistance Needed: pt has been using a wheelchair to get around the last 3 weeks. ADL's / Homemaking Assistance Needed: pt has had assist with LB dressing.        OT Diagnosis: Generalized weakness   OT Problem List: Decreased strength;Decreased knowledge of use of DME or AE   OT Treatment/Interventions: Self-care/ADL training;Patient/family education;Therapeutic activities;DME and/or AE instruction    OT Goals(Current goals can be found in the care plan section) Acute  Rehab OT Goals Patient Stated Goal: to be able to walk OT Goal Formulation: With patient Time For Goal Achievement: 07/22/15 Potential to Achieve Goals: Good ADL Goals Pt Will Perform Grooming: with min guard assist;standing Pt Will Transfer to Toilet: with min guard assist;ambulating;bedside commode Pt Will Perform Toileting - Clothing Manipulation and hygiene: with min guard assist;sit to/from stand Pt Will Perform Tub/Shower Transfer: Shower transfer;with min assist;3 in 1;rolling walker Additional ADL Goal #1: Pt/family will perform LB dressing with independence.   OT Frequency: Min 2X/week   Barriers to D/C:            Co-evaluation              End of Session Equipment Utilized During Treatment: Gait belt;Rolling walker;Right knee immobilizer  Activity Tolerance: Patient tolerated treatment well Patient left: in chair;with call bell/phone within reach   Time: 1100-1130 OT Time Calculation (min): 30 min Charges:  OT General Charges $OT Visit: 1 Procedure OT Evaluation $Initial OT Evaluation Tier I: 1 Procedure OT Treatments $Therapeutic Activity: 8-22 mins G-Codes:    Jules Schick  [phone] 07/15/2015, 12:27 PM

## 2015-07-15 NOTE — Discharge Summary (Signed)
Physician Discharge Summary   Patient ID: Sharon Gordon MRN: [id number] DOB/AGE: [birth year] 81 y.o.  Admit date: 07/14/2015 Discharge date: 07-16-2015  Primary Diagnosis:  Osteoarthritis Right knee(s) Admission Diagnoses:  Past Medical History  Diagnosis Date  . Hypertension   . Hypothyroidism   . Lateral meniscal tear     right  . Vein symptom     patients reports vein behind left leg at knee hurting at times for last week  . Arthritis    Discharge Diagnoses:   Principal Problem:   OA (osteoarthritis) of knee  Estimated body mass index is 23.23 kg/(m^2) as calculated from the following:   Height as of this encounter: _0  (1.626 m).   Weight as of this encounter: 61.406 kg (135 lb 6 oz).  Procedure:  Procedure(s) (LRB): TOTAL RIGHT KNEE ARTHROPLASTY (Right)   Consults: None  HPI: Sharon Gordon is a 81 y.o. year old female with end stage OA of her right knee with rapidly progressive worsening pain and dysfunction. She has constant pain, with activity and at rest and significant functional deficits with difficulties even with ADLs. She has had extensive non-op management including analgesics, injections of cortisone, and home exercise program, but remains in significant pain with significant dysfunction.Radiographs show progressive bone on bone arthritis in the lateral compartment. She presents now for right Total Knee Arthroplasty.   Laboratory Data: Admission on 07/14/2015  Component Date Value Ref Range Status  . WBC 07/15/2015 8.1  4.0 - 10.5 K/uL Final  . RBC 07/15/2015 3.10* 3.87 - 5.11 MIL/uL Final  . Hemoglobin 07/15/2015 10.1* 12.0 - 15.0 g/dL Final  . HCT 07/15/2015 30.8* 36.0 - 46.0 % Final  . MCV 07/15/2015 99.4  78.0 - 100.0 fL Final  . MCH 07/15/2015 32.6  26.0 - 34.0 pg Final  . MCHC 07/15/2015 32.8  30.0 - 36.0 g/dL Final  . RDW 07/15/2015 14.9  11.5 - 15.5 % Final  . Platelets 07/15/2015 352  150 - 400 K/uL Final  . Sodium 07/15/2015 139  135 - 145  mmol/L Final  . Potassium 07/15/2015 4.9  3.5 - 5.1 mmol/L Final  . Chloride 07/15/2015 106  101 - 111 mmol/L Final  . CO2 07/15/2015 27  22 - 32 mmol/L Final  . Glucose, Bld 07/15/2015 146* 65 - 99 mg/dL Final  . BUN 07/15/2015 25* 6 - 20 mg/dL Final  . Creatinine, Ser 07/15/2015 1.11* 0.44 - 1.00 mg/dL Final  . Calcium 07/15/2015 8.8* 8.9 - 10.3 mg/dL Final  . GFR calc non Af Amer 07/15/2015 44* >60 mL/min Final  . GFR calc Af Amer 07/15/2015 51* >60 mL/min Final   Comment: (NOTE) The eGFR has been calculated using the CKD EPI equation. This calculation has not been validated in all clinical situations. eGFR's persistently <60 mL/min signify possible Chronic Kidney Disease.   [name] gap 07/15/2015 6  5 - 15 Final  Hospital Outpatient Visit on 07/09/2015  Component Date Value Ref Range Status  . MRSA, PCR 07/09/2015 NEGATIVE  NEGATIVE Final  . Staphylococcus aureus 07/09/2015 POSITIVE* NEGATIVE Final   Comment:        The Xpert SA Assay (FDA approved for NASAL specimens in patients over 27 years of age), is one component of a comprehensive surveillance program.  Test performance has been validated by [facility] for patients greater than or equal to 81 year old. It is not intended to diagnose infection nor to guide or monitor treatment.   Marland Kitchen aPTT 07/09/2015 30  24 - 37 seconds Final  . WBC 07/09/2015 6.9  4.0 - 10.5 K/uL Final  . RBC 07/09/2015 3.80* 3.87 - 5.11 MIL/uL Final  . Hemoglobin 07/09/2015 12.2  12.0 - 15.0 g/dL Final  . HCT 07/09/2015 38.1  36.0 - 46.0 % Final  . MCV 07/09/2015 100.3* 78.0 - 100.0 fL Final  . MCH 07/09/2015 32.1  26.0 - 34.0 pg Final  . MCHC 07/09/2015 32.0  30.0 - 36.0 g/dL Final  . RDW 07/09/2015 15.1  11.5 - 15.5 % Final  . Platelets 07/09/2015 435* 150 - 400 K/uL Final  . Sodium 07/09/2015 139  135 - 145 mmol/L Final  . Potassium 07/09/2015 5.5* 3.5 - 5.1 mmol/L Final   NO VISIBLE HEMOLYSIS  . Chloride 07/09/2015 104  101 - 111 mmol/L  Final  . CO2 07/09/2015 29  22 - 32 mmol/L Final  . Glucose, Bld 07/09/2015 93  65 - 99 mg/dL Final  . BUN 07/09/2015 37* 6 - 20 mg/dL Final  . Creatinine, Ser 07/09/2015 1.48* 0.44 - 1.00 mg/dL Final  . Calcium 07/09/2015 9.7  8.9 - 10.3 mg/dL Final  . Total Protein 07/09/2015 7.2  6.5 - 8.1 g/dL Final  . Albumin 07/09/2015 3.9  3.5 - 5.0 g/dL Final  . AST 07/09/2015 18  15 - 41 U/L Final  . ALT 07/09/2015 15  14 - 54 U/L Final  . Alkaline Phosphatase 07/09/2015 99  38 - 126 U/L Final  . Total Bilirubin 07/09/2015 0.6  0.3 - 1.2 mg/dL Final  . GFR calc non Af Amer 07/09/2015 31* >60 mL/min Final  . GFR calc Af Amer 07/09/2015 36* >60 mL/min Final   Comment: (NOTE) The eGFR has been calculated using the CKD EPI equation. This calculation has not been validated in all clinical situations. eGFR's persistently <60 mL/min signify possible Chronic Kidney Disease.   . Anion gap 07/09/2015 6  5 - 15 Final  . Prothrombin Time 07/09/2015 13.6  11.6 - 15.2 seconds Final  . INR 07/09/2015 1.02  0.00 - 1.49 Final  . ABO/RH(D) 07/09/2015 A POS   Final  . Antibody Screen 07/09/2015 NEG   Final  . Sample Expiration 07/09/2015 07/17/2015   Final  . Color, Urine 07/09/2015 YELLOW  YELLOW Final  . APPearance 07/09/2015 CLOUDY* CLEAR Final  . Specific Gravity, Urine 07/09/2015 1.011  1.005 - 1.030 Final  . pH 07/09/2015 5.0  5.0 - 8.0 Final  . Glucose, UA 07/09/2015 NEGATIVE  NEGATIVE mg/dL Final  . Hgb urine dipstick 07/09/2015 MODERATE* NEGATIVE Final  . Bilirubin Urine 07/09/2015 NEGATIVE  NEGATIVE Final  . Ketones, ur 07/09/2015 NEGATIVE  NEGATIVE mg/dL Final  . Protein, ur 07/09/2015 NEGATIVE  NEGATIVE mg/dL Final  . Urobilinogen, UA 07/09/2015 0.2  0.0 - 1.0 mg/dL Final  . Nitrite 07/09/2015 NEGATIVE  NEGATIVE Final  . Leukocytes, UA 07/09/2015 MODERATE* NEGATIVE Final  . Squamous Epithelial / LPF 07/09/2015 RARE  RARE Final  . WBC, UA 07/09/2015 21-50  <3 WBC/hpf Final   WBC CLUMPS  .  RBC / HPF 07/09/2015 11-20  <3 RBC/hpf Final  . Bacteria, UA 07/09/2015 MANY* RARE Final  . Urine-Other 07/09/2015 MUCOUS PRESENT   Final  . ABO/RH(D) 07/09/2015 A POS   Final     X-Rays:No results found.  EKG: Orders placed or performed during the hospital encounter of 03/10/15  . EKG 12-Lead  . EKG 12-Lead     Hospital Course: Sharon Gordon is a 81 y.o. who was admitted to  [facility]. They were brought to the operating room on 07/14/2015 and underwent Procedure(s): TOTAL RIGHT KNEE ARTHROPLASTY.  Patient tolerated the procedure well and was later transferred to the recovery room and then to the orthopaedic floor for postoperative care.  They were given PO and IV analgesics for pain control following their surgery.  They were given 24 hours of postoperative antibiotics of  Anti-infectives    Start     Dose/Rate Route Frequency Ordered Stop   07/14/15 2300  ceFAZolin (ANCEF) IVPB 2 g/50 mL premix     2 g 100 mL/hr over 30 Minutes Intravenous Every 6 hours 07/14/15 2005 07/15/15 0634   07/14/15 1545  ceFAZolin (ANCEF) IVPB 2 g/50 mL premix     2 g 100 mL/hr over 30 Minutes Intravenous On call to O.R. 07/14/15 1529 07/14/15 1656     and started on DVT prophylaxis in the form of Eliquis.   PT and OT were ordered for total joint protocol.  Discharge planning consulted to help with postop disposition and equipment needs.  Patient had a decent night on the evening of surgery.  They started to get up OOB with therapy on day one. Hemovac drain was pulled without difficulty.  Continued to work with therapy into day two.  Dressing was changed on day two and the incision was healing well.   Patient was seen in rounds and was ready to go home.  Discharge home with home health Diet - Cardiac diet Follow up - in 2 weeks Activity - WBAT Disposition - Home Condition Upon Discharge - Good D/C Meds - See DC Summary DVT Prophylaxis - Eliquis  Discharge Instructions    Call MD / Call 911     Complete by:  As directed   If you experience chest pain or shortness of breath, CALL 911 and be transported to the hospital emergency room.  If you develope a fever above 101 F, pus (white drainage) or increased drainage or redness at the wound, or calf pain, call your surgeon's office.     Change dressing    Complete by:  As directed   Change dressing daily with sterile 4 x 4 inch gauze dressing and apply TED hose. Do not submerge the incision under water.     Constipation Prevention    Complete by:  As directed   Drink plenty of fluids.  Prune juice may be helpful.  You may use a stool softener, such as Colace (over the counter) 100 mg twice a day.  Use MiraLax (over the counter) for constipation as needed.     Diet - low sodium heart healthy    Complete by:  As directed      Discharge instructions    Complete by:  As directed   Pick up stool softner and laxative for home use following surgery while on pain medications. Do not submerge incision under water. Please use good hand washing techniques while changing dressing each day. May shower starting three days after surgery. Please use a clean towel to pat the incision dry following showers. Continue to use ice for pain and swelling after surgery. Do not use any lotions or creams on the incision until instructed by your surgeon.   Take Eliquis twice a day for two and a half more weeks, then discontinue Eliquis. Once the patient has completed the blood thinner regimen, then take a Baby 81 mg Aspirin daily for three more weeks.  Postoperative Constipation Protocol  Constipation - defined medically  as fewer than three stools per week and severe constipation as less than one stool per week.  One of the most common issues patients have following surgery is constipation.  Even if you have a regular bowel pattern at home, your normal regimen is likely to be disrupted due to multiple reasons following surgery.  Combination of anesthesia,  postoperative narcotics, change in appetite and fluid intake all can affect your bowels.  In order to avoid complications following surgery, here are some recommendations in order to help you during your recovery period.  Colace (docusate) - Pick up an over-the-counter form of Colace or another stool softener and take twice a day as long as you are requiring postoperative pain medications.  Take with a full glass of water daily.  If you experience loose stools or diarrhea, hold the colace until you stool forms back up.  If your symptoms do not get better within 1 week or if they get worse, check with your doctor.  Dulcolax (bisacodyl) - Pick up over-the-counter and take as directed by the product packaging as needed to assist with the movement of your bowels.  Take with a full glass of water.  Use this product as needed if not relieved by Colace only.   MiraLax (polyethylene glycol) - Pick up over-the-counter to have on hand.  MiraLax is a solution that will increase the amount of water in your bowels to assist with bowel movements.  Take as directed and can mix with a glass of water, juice, soda, coffee, or tea.  Take if you go more than two days without a movement. Do not use MiraLax more than once per day. Call your doctor if you are still constipated or irregular after using this medication for 7 days in a row.  If you continue to have problems with postoperative constipation, please contact the office for further assistance and recommendations.  If you experience "the worst abdominal pain ever" or develop nausea or vomiting, please contact the office immediatly for further recommendations for treatment.     Do not put a pillow under the knee. Place it under the heel.    Complete by:  As directed      Do not sit on low chairs, stoools or toilet seats, as it may be difficult to get up from low surfaces    Complete by:  As directed      Driving restrictions    Complete by:  As directed   No driving  until released by the physician.     Increase activity slowly as tolerated    Complete by:  As directed      Lifting restrictions    Complete by:  As directed   No lifting until released by the physician.     Patient may shower    Complete by:  As directed   You may shower without a dressing once there is no drainage.  Do not wash over the wound.  If drainage remains, do not shower until drainage stops.     TED hose    Complete by:  As directed   Use stockings (TED hose) for 3 weeks on both leg(s).  You may remove them at night for sleeping.     Weight bearing as tolerated    Complete by:  As directed   Laterality:  right  Extremity:  Lower            Medication List    STOP taking these medications  cholecalciferol 1000 UNITS tablet  Commonly known as:  VITAMIN D     diclofenac 75 MG EC tablet  Commonly known as:  VOLTAREN     Fish Oil 600 MG Caps     HYDROcodone-acetaminophen 5-325 MG tablet  Commonly known as:  NORCO     multivitamin with minerals Tabs tablet     vitamin E 400 UNIT capsule      TAKE these medications        amLODipine 5 MG tablet  Commonly known as:  NORVASC  Take 5 mg by mouth every evening.     apixaban 2.5 MG Tabs tablet  Commonly known as:  ELIQUIS  Take 1 tablet (2.5 mg total) by mouth every 12 (twelve) hours. Take Eliquis twice a day for two and a half more weeks, then discontinue Eliquis. Once the patient has completed the blood thinner regimen, then take a Baby 81 mg Aspirin daily for three more weeks.     DULoxetine 30 MG capsule  Commonly known as:  CYMBALTA  Take 30 mg by mouth every morning.     gabapentin 300 MG capsule  Commonly known as:  NEURONTIN  Take 600 mg by mouth 3 (three) times daily.     levothyroxine 50 MCG tablet  Commonly known as:  SYNTHROID, LEVOTHROID  Take 50 mcg by mouth daily before breakfast.     lisinopril-hydrochlorothiazide 20-12.5 MG tablet  Commonly known as:  PRINZIDE,ZESTORETIC  Take 1  tablet by mouth 2 (two) times daily.     methocarbamol 500 MG tablet  Commonly known as:  ROBAXIN  Take 1 tablet (500 mg total) by mouth every 6 (six) hours as needed for muscle spasms.     oxyCODONE 5 MG immediate release tablet  Commonly known as:  Oxy IR/ROXICODONE  Take 1-2 tablets (5-10 mg total) by mouth every 3 (three) hours as needed for moderate pain or severe pain.     traMADol 50 MG tablet  Commonly known as:  ULTRAM  Take 1-2 tablets (50-100 mg total) by mouth every 6 (six) hours as needed (mild pain).           Follow-up Information    Follow up with Gearlean Alf, MD On 07/29/2015.   Specialty:  Orthopedic Surgery   Why:  Call office at [phone] to setup appointment with Dr. Wynelle Link on Tuesday 07/29/2015.   Contact information:   [address] [place] [address] [phone]       Signed: Arlee Muslim, PA-C Orthopaedic Surgery 07/15/2015, 9:13 AM

## 2015-07-16 LAB — BASIC METABOLIC PANEL
ANION GAP: 7 (ref 5–15)
BUN: 26 mg/dL — AB (ref 6–20)
CALCIUM: 8.7 mg/dL — AB (ref 8.9–10.3)
CO2: 27 mmol/L (ref 22–32)
Chloride: 107 mmol/L (ref 101–111)
Creatinine, Ser: 1.13 mg/dL — ABNORMAL HIGH (ref 0.44–1.00)
GFR calc Af Amer: 50 mL/min — ABNORMAL LOW (ref >60)
GFR calc non Af Amer: 43 mL/min — ABNORMAL LOW (ref >60)
GLUCOSE: 110 mg/dL — AB (ref 65–99)
Potassium: 4.1 mmol/L (ref 3.5–5.1)
Sodium: 141 mmol/L (ref 135–145)

## 2015-07-16 LAB — CBC
HEMATOCRIT: 28.7 % — AB (ref 36.0–46.0)
Hemoglobin: 9.2 g/dL — ABNORMAL LOW (ref 12.0–15.0)
MCH: 32.4 pg (ref 26.0–34.0)
MCHC: 32.1 g/dL (ref 30.0–36.0)
MCV: 101.1 fL — AB (ref 78.0–100.0)
PLATELETS: 354 10*3/uL (ref 150–400)
RBC: 2.84 MIL/uL — ABNORMAL LOW (ref 3.87–5.11)
RDW: 15.8 % — AB (ref 11.5–15.5)
WBC: 8.6 10*3/uL (ref 4.0–10.5)

## 2015-07-16 NOTE — Progress Notes (Signed)
Physical Therapy Treatment Patient Details Name: Ori Kreiter MRN: [id number] DOB: 1928-06-01 Today's Date: 07/16/2015    History of Present Illness 81 yo female s/p R TKA 07/14/15. Hx of back surgery    PT Comments    Progressing well with mobility. Practiced/reviewed exercises, gait training, and step negotiation. Ready to d/c from PT standpoint.   Follow Up Recommendations  Home health PT     Equipment Recommendations  Rolling walker with 5" wheels    Recommendations for Other Services       Precautions / Restrictions Precautions Precautions: Knee Required Braces or Orthoses: Knee Immobilizer - Right Knee Immobilizer - Right: Discontinue once straight leg raise with < 10 degree lag Restrictions Weight Bearing Restrictions: No RLE Weight Bearing: Weight bearing as tolerated    Mobility  Bed Mobility Overal bed mobility: Needs Assistance Bed Mobility: Sit to Supine     Supine to sit: Min guard Sit to supine: Min assist   General bed mobility comments: assist for LE onto bed.  Transfers Overall transfer level: Needs assistance Equipment used: Rolling walker (2 wheeled) Transfers: Sit to/from Stand Sit to Stand: Min guard         General transfer comment: cues for hand placement and LE management.close guard for safety  Ambulation/Gait Ambulation/Gait assistance: Min guard Ambulation Distance (Feet): 75 Feet Assistive device: Rolling walker (2 wheeled) Gait Pattern/deviations: Step-to pattern;Antalgic     General Gait Details: close guard for safety. VCs safety, technique, sequence. slow, steady pace.    Stairs Stairs: Yes Stairs assistance: Min assist Stair Management: Backwards;With walker   General stair comments: VCs safety, technique, sequence. Assist to manage walker.   Wheelchair Mobility    Modified Rankin (Stroke Patients Only)       Balance                                    Cognition Arousal/Alertness:  Awake/alert Behavior During Therapy: WFL for tasks assessed/performed Overall Cognitive Status: Within Functional Limits for tasks assessed                      Exercises Total Joint Exercises Ankle Circles/Pumps: AROM;Both;10 reps;Supine Quad Sets: AROM;Both;10 reps;Supine Heel Slides: AAROM;Right;10 reps;Supine Hip ABduction/ADduction: AAROM;Right;10 reps;Supine Straight Leg Raises: AROM;Right;10 reps;Supine Goniometric ROM: ~10-55 degrees    General Comments        Pertinent Vitals/Pain Pain Assessment: 0-10 Pain Score: 6  Pain Location: R knee Pain Descriptors / Indicators: Aching;Sore Pain Intervention(s): Monitored during session;Repositioned;Ice applied    Home Living                      Prior Function            PT Goals (current goals can now be found in the care plan section) Progress towards PT goals: Progressing toward goals    Frequency  7X/week    PT Plan Current plan remains appropriate    Co-evaluation             End of Session Equipment Utilized During Treatment: Gait belt;Right knee immobilizer Activity Tolerance: Patient tolerated treatment well Patient left: in bed;with call bell/phone within reach     Time: 0942-1000 PT Time Calculation (min) (ACUTE ONLY): 18 min  Charges:  $Gait Training: 8-22 mins                    G Codes:  Weston Anna, MPT Pager: [phone]

## 2015-07-16 NOTE — Progress Notes (Signed)
   Subjective: 2 Days Post-Op Procedure(s) (LRB): TOTAL RIGHT KNEE ARTHROPLASTY (Right) Patient reports pain as mild.   Patient seen in rounds with Dr. Wynelle Link. Patient is well, but has had some minor complaints of pain in the knee, requiring pain medications Patient is ready to go home.  Objective: Vital signs in last 24 hours: Temp:  [96.6 F (35.9 C)-99.9 F (37.7 C)] 98.3 F (36.8 C) (10/12 0622) Pulse Rate:  [80-92] 80 (10/12 0622) Resp:  [16-18] 16 (10/12 0622) BP: (95-118)/(41-79) 118/65 mmHg (10/12 0622) SpO2:  [94 %-98 %] 98 % (10/12 0622)  Intake/Output from previous day:  Intake/Output Summary (Last 24 hours) at 07/16/15 0700 Last data filed at 07/16/15 5638  Gross per 24 hour  Intake    960 ml  Output   1850 ml  Net   -890 ml    Intake/Output this shift: Total I/O In: 240 [P.O.:240] Out: 900 [Urine:900]  Labs:  Recent Labs  07/15/15 0520 07/16/15 0440  HGB 10.1* 9.2*    Recent Labs  07/15/15 0520 07/16/15 0440  WBC 8.1 8.6  RBC 3.10* 2.84*  HCT 30.8* 28.7*  PLT 352 354    Recent Labs  07/15/15 0520 07/16/15 0440  NA 139 141  K 4.9 4.1  CL 106 107  CO2 27 27  BUN 25* 26*  CREATININE 1.11* 1.13*  GLUCOSE 146* 110*  CALCIUM 8.8* 8.7*   No results for input(s): LABPT, INR in the last 72 hours.  EXAM: General - Patient is Alert, Appropriate and Oriented Extremity - Neurovascular intact Sensation intact distally Dorsiflexion/Plantar flexion intact Incision - clean, dry, no drainage Motor Function - intact, moving foot and toes well on exam.   Assessment/Plan: 2 Days Post-Op Procedure(s) (LRB): TOTAL RIGHT KNEE ARTHROPLASTY (Right) Procedure(s) (LRB): TOTAL RIGHT KNEE ARTHROPLASTY (Right) Past Medical History  Diagnosis Date  . Hypertension   . Hypothyroidism   . Lateral meniscal tear     right  . Vein symptom     patients reports vein behind left leg at knee hurting at times for last week  . Arthritis    Principal  Problem:   OA (osteoarthritis) of knee  Estimated body mass index is 23.23 kg/(m^2) as calculated from the following:   Height as of this encounter: 5\' 4"  (1.626 m).   Weight as of this encounter: 61.406 kg (135 lb 6 oz). Up with therapy Discharge home with home health Diet - Cardiac diet Follow up - in 2 weeks Activity - WBAT Disposition - Home Condition Upon Discharge - Good D/C Meds - See DC Summary DVT Prophylaxis - Eliquis  Arlee Muslim, PA-C Orthopaedic Surgery 07/16/2015, 7:00 AM

## 2015-07-16 NOTE — Progress Notes (Signed)
Occupational Therapy Treatment Patient Details Name: Sharon Gordon MRN: [id number] DOB: 1927-12-25 Today's Date: 07/16/2015    History of present illness 81 yo female s/p R TKA 07/14/15. Hx of back surgery   OT comments  Pt did well. Ready for d/c today. Educated on shower transfer, LB dressing and toilet transfer safety. Pt educated on having assist initially when up especially for clothing management, etc for balance support. Pt verbalized understanding.   Follow Up Recommendations  No OT follow up;Supervision/Assistance - 24 hour    Equipment Recommendations  3 in 1 bedside comode;Other (comment) (RW)    Recommendations for Other Services      Precautions / Restrictions Precautions Precautions: Knee Required Braces or Orthoses: Knee Immobilizer - Right Knee Immobilizer - Right: Discontinue once straight leg raise with < 10 degree lag Restrictions RLE Weight Bearing: Weight bearing as tolerated       Mobility Bed Mobility Overal bed mobility: Needs Assistance Bed Mobility: Supine to Sit     Supine to sit: Min guard     General bed mobility comments: min guard for safety.  Transfers Overall transfer level: Needs assistance Equipment used: Rolling walker (2 wheeled) Transfers: Sit to/from Stand Sit to Stand: Min guard         General transfer comment: cues for hand placement and LE management.    Balance                                   ADL       Grooming: Min Dispensing optician: Min guard;Ambulation;BSC;RW   Toileting- Clothing Manipulation and Hygiene: Minimal assistance;Sit to/from stand   Tub/ Shower Transfer: Walk-in shower;Rolling walker;Min guard     General ADL Comments: t sponge bathed at EOB and dressed. Reviewed sequence for LB dressing and pt did well with starting pants over LEs with only min assist for gripper socks to not stick to pants. Min cues to not pick up the walker with  transfer to commdoe. light min assist for balance to pull up pants at commode. Issued shower transfer handout and practiced shower transfer. Pt did well.       Vision                     Perception     Praxis      Cognition   Behavior During Therapy: WFL for tasks assessed/performed Overall Cognitive Status: Within Functional Limits for tasks assessed                       Extremity/Trunk Assessment               Exercises     Shoulder Instructions       General Comments      Pertinent Vitals/ Pain       Pain Assessment: 0-10 Pain Score: 4  Pain Location: R knee Pain Descriptors / Indicators: Aching Pain Intervention(s): Repositioned;Ice applied  Home Living                                          Prior Functioning/Environment              Frequency Min 2X/week  Progress Toward Goals  OT Goals(current goals can now be found in the care plan section)  Progress towards OT goals: Progressing toward goals     Plan Discharge plan remains appropriate    Co-evaluation                 End of Session Equipment Utilized During Treatment: Rolling walker;Right knee immobilizer   Activity Tolerance     Patient Left in chair;with call bell/phone within reach   Nurse Communication          Time: 7408-1448 OT Time Calculation (min): 26 min  Charges: OT General Charges $OT Visit: 1 Procedure OT Treatments $Self Care/Home Management : 8-22 mins $Therapeutic Activity: 8-22 mins  Jules Schick  [phone] 07/16/2015, 10:39 AM

## 2015-07-16 NOTE — Care Management Important Message (Signed)
Important Message  Patient Details  Name: [name] MRN: [id number] Date of Birth: [birth year]   Medicare Important Message Given:  Yes-second notification given    Camillo Flaming 07/16/2015, 11:44 AMImportant Message  Patient Details  Name: [name] MRN: [id number] Date of Birth: 08-23-1928   Medicare Important Message Given:  Yes-second notification given    Camillo Flaming 07/16/2015, 11:43 AM

## 2015-08-05 ENCOUNTER — Ambulatory Visit: Payer: Medicare Other | Attending: Orthopedic Surgery | Admitting: Physical Therapy

## 2015-08-05 DIAGNOSIS — M25561 Pain in right knee: Secondary | ICD-10-CM | POA: Diagnosis present

## 2015-08-05 DIAGNOSIS — M25661 Stiffness of right knee, not elsewhere classified: Secondary | ICD-10-CM | POA: Diagnosis present

## 2015-08-05 NOTE — Therapy (Signed)
[facility] [place], [place], [address] Phone: [phone]   Fax:  [phone]  Physical Therapy Evaluation  Patient Details  Name: Sharon Gordon MRN: [id number] Date of Birth: [birth year] Referring Provider: Gaynelle Arabian MD.  Encounter Date: 08/05/2015      PT End of Session - 08/05/15 1452    Visit Number 1   Number of Visits 8   Date for PT Re-Evaluation 09/09/15   PT Start Time 0145   PT Stop Time 0247   PT Time Calculation (min) 62 min   Activity Tolerance Patient tolerated treatment well   Behavior During Therapy [facility] for tasks assessed/performed      Past Medical History  Diagnosis Date  . Hypertension   . Hypothyroidism   . Lateral meniscal tear     right  . Vein symptom     patients reports vein behind left leg at knee hurting at times for last week  . Arthritis     Past Surgical History  Procedure Laterality Date  . Bladder tach  1980's  . Abdominal hysterectomy  1980's    complete  . Colonscopy    . Back surgery  2010  . Knee arthroscopy Right 03/12/2015    Procedure: ARTHROSCOPY RIGHT KNEE WITH LATERAL MENISCAL DEBRIDEMENT;  Surgeon: Gaynelle Arabian, MD;  Location: [facility];  Service: Orthopedics;  Laterality: Right;  . Total knee arthroplasty Right 07/14/2015    Procedure: TOTAL RIGHT KNEE ARTHROPLASTY;  Surgeon: Gaynelle Arabian, MD;  Location: [facility];  Service: Orthopedics;  Laterality: Right;    There were no vitals filed for this visit.  Visit Diagnosis:  Right knee pain - Plan: PT plan of care cert/re-cert  Knee stiffness, right - Plan: PT plan of care cert/re-cert          [facility] PT Assessment - 08/05/15 0001    Assessment   Medical Diagnosis Right total knee replacement.   Referring Provider Gaynelle Arabian MD.   Precautions   Precautions --  No ultrasound.   Restrictions   Weight Bearing Restrictions No   Balance Screen   Has the patient fallen in the past 6 months No   Has the  patient had a decrease in activity level because of a fear of falling?  No   Is the patient reluctant to leave their home because of a fear of falling?  No   Home Environment   Living Environment Private residence   Prior Function   Level of Independence Independent   Observation/Other Assessments-Edema    Edema Circumferential   Circumferential Edema   Circumferential - Right 2.5 cms > left.   ROM / Strength   AROM / PROM / Strength AROM;Strength   AROM   Overall AROM Comments -5 degrees to full passive right knee extension with active flexion= 110 degrees and passive= 115 degrees.   Strength   Overall Strength Comments Right hip and kne strength= 4+/5.   Palpation   Palpation comment Tender to palpation in right medial patellar region.   Ambulation/Gait   Gait Comments The patient walks with her right knee held in minimal flexion.                   [facility] Adult PT Treatment/Exercise - 08/05/15 0001    Exercises   Exercises Knee/Hip   Knee/Hip Exercises: Aerobic   Nustep --  Level 5 x 10 minutes.   Modalities   Modalities Network engineer  Stimulation Location Right knee.   Electrical Stimulation Action --  1-10 HZ x 20 minutes on 100% scan.   Electrical Stimulation Goals Edema;Pain   Vasopneumatic   Number Minutes Vasopneumatic  20 minutes   Vasopnuematic Location  --  Right knee.   Vasopneumatic Pressure Medium                  PT Short Term Goals - 08/20/15 1525    PT SHORT TERM GOAL #1   Title Ind with HEP.   Time 2   Period Weeks   Status New   PT SHORT TERM GOAL #2   Title Full active right knee extension.   Time 2   Period Weeks   Status New           PT Long Term Goals - 08-20-15 1526    PT LONG TERM GOAL #1   Title Active right knee flexion to 125 degrees+ so the patient can perform functional tasks and do so with pain not > 2-3/10.   Time 4   Period Weeks   Status  New   PT LONG TERM GOAL #2   Title 5/5 right knee strength.   Time 4   Period Weeks   Status New   PT LONG TERM GOAL #3   Title Perform a reciprocating stair gait with one railing with pain not > 2-3/10.   Time 4   Period Weeks   Status New   PT LONG TERM GOAL #4   Title Perforrm ADL's with pain not > 3/10.   Time 4   Period Weeks   Status New               Plan - 08/20/15 1453    Clinical Impression Statement The patient underwent a right total knee replacement on 07/14/15.  She is pleased with her progress thus far and had some home health physical therapy and is compliant to her HEP.  Her resting pain-level is a 3-4/10 today.     Pt will benefit from skilled therapeutic intervention in order to improve on the following deficits Pain;Decreased activity tolerance;Decreased range of motion   Rehab Potential Excellent   PT Frequency 2x / week   PT Duration 4 weeks   PT Treatment/Interventions Electrical Stimulation;Vasopneumatic Device;Therapeutic exercise;Therapeutic activities;Manual techniques;ADLs/Self Care Home Management;Passive range of motion   PT Next Visit Plan Right total knee replacement protocol.  Electrical stimulation and vasopneumatic.   Consulted and Agree with Plan of Care Patient          G-Codes - 08-20-2015 1524    Functional Assessment Tool Used FOTO.   Functional Limitation Mobility: Walking and moving around   Mobility: Walking and Moving Around Current Status [phone]) At least 40 percent but less than 60 percent impaired, limited or restricted   Mobility: Walking and Moving Around Goal Status [phone]) At least 1 percent but less than 20 percent impaired, limited or restricted       Problem List Patient Active Problem List   Diagnosis Date Noted  . OA (osteoarthritis) of knee 07/14/2015  . Lateral meniscal tear 03/12/2015    Mykel Mohl, [place] MPT Aug 20, 2015, 3:29 PM  [facility] [place], [place], [address] Phone: [phone]   Fax:  [phone]  Name: Sharon Gordon MRN: [id number] Date of Birth: 03-05-1928

## 2015-08-07 ENCOUNTER — Ambulatory Visit: Payer: Medicare Other | Admitting: Physical Therapy

## 2015-08-07 DIAGNOSIS — M25561 Pain in right knee: Secondary | ICD-10-CM | POA: Diagnosis not present

## 2015-08-07 DIAGNOSIS — M25661 Stiffness of right knee, not elsewhere classified: Secondary | ICD-10-CM

## 2015-08-07 NOTE — Therapy (Signed)
[facility] [place], [place], [address] Phone: [phone]   Fax:  [phone]  Physical Therapy Treatment  Patient Details  Name: Sharon Gordon MRN: [id number] Date of Birth: Sep 15, 1928 Referring Provider: Gaynelle Arabian MD.  Encounter Date: 08/07/2015      PT End of Session - 08/07/15 1451    Visit Number 2   Number of Visits 8   Date for PT Re-Evaluation 09/09/15   PT Start Time 0145   PT Stop Time 0243   PT Time Calculation (min) 58 min   Activity Tolerance Patient tolerated treatment well   Behavior During Therapy [facility] for tasks assessed/performed      Past Medical History  Diagnosis Date  . Hypertension   . Hypothyroidism   . Lateral meniscal tear     right  . Vein symptom     patients reports vein behind left leg at knee hurting at times for last week  . Arthritis     Past Surgical History  Procedure Laterality Date  . Bladder tach  1980's  . Abdominal hysterectomy  1980's    complete  . Colonscopy    . Back surgery  2010  . Knee arthroscopy Right 03/12/2015    Procedure: ARTHROSCOPY RIGHT KNEE WITH LATERAL MENISCAL DEBRIDEMENT;  Surgeon: Gaynelle Arabian, MD;  Location: [facility];  Service: Orthopedics;  Laterality: Right;  . Total knee arthroplasty Right 07/14/2015    Procedure: TOTAL RIGHT KNEE ARTHROPLASTY;  Surgeon: Gaynelle Arabian, MD;  Location: [facility];  Service: Orthopedics;  Laterality: Right;    There were no vitals filed for this visit.  Visit Diagnosis:  Right knee pain  Knee stiffness, right      Subjective Assessment - 08/07/15 1354    Subjective (p) I twisted last night in the shower and my knee started huring a lot and feels stiff today.   Currently in Pain? (p) Yes   Pain Score (p) 6                                    PT Short Term Goals - 08/05/15 1525    PT SHORT TERM GOAL #1   Title Ind with HEP.   Time 2   Period Weeks   Status New   PT SHORT TERM  GOAL #2   Title Full active right knee extension.   Time 2   Period Weeks   Status New           PT Long Term Goals - 08/05/15 1526    PT LONG TERM GOAL #1   Title Active right knee flexion to 125 degrees+ so the patient can perform functional tasks and do so with pain not > 2-3/10.   Time 4   Period Weeks   Status New   PT LONG TERM GOAL #2   Title 5/5 right knee strength.   Time 4   Period Weeks   Status New   PT LONG TERM GOAL #3   Title Perform a reciprocating stair gait with one railing with pain not > 2-3/10.   Time 4   Period Weeks   Status New   PT LONG TERM GOAL #4   Title Perforrm ADL's with pain not > 3/10.   Time 4   Period Weeks   Status New               Problem List Patient  Active Problem List   Diagnosis Date Noted  . OA (osteoarthritis) of knee 07/14/2015  . Lateral meniscal tear 03/12/2015   Treatment:  Nustep level 4 x  20 minutes f/b STW/M x 10 minutes to patient's right knee f/b IFC and medium vasopneumatic x 15 minutes.  Patient tolerated treatment well.    Fawzi Melman, [place] MPT 08/07/2015, 2:53 PM  [facility] [address] [place], [place], [address] Phone: [phone]   Fax:  [phone]  Name: Sharon Gordon MRN: [id number] Date of Birth: March 19, 1928

## 2015-08-12 ENCOUNTER — Encounter: Payer: Medicare Other | Admitting: *Deleted

## 2015-08-14 ENCOUNTER — Ambulatory Visit: Payer: Medicare Other | Admitting: *Deleted

## 2015-08-14 DIAGNOSIS — M25561 Pain in right knee: Secondary | ICD-10-CM | POA: Diagnosis not present

## 2015-08-14 DIAGNOSIS — M25661 Stiffness of right knee, not elsewhere classified: Secondary | ICD-10-CM

## 2015-08-14 NOTE — Therapy (Signed)
[facility] [place], [place], [address] Phone: [phone]   Fax:  [phone]  Physical Therapy Treatment  Patient Details  Name: Sharon Gordon MRN: [id number] Date of Birth: 1928/02/23 Referring Provider: Gaynelle Arabian MD.  Encounter Date: 08/14/2015      PT End of Session - 08/14/15 1435    Visit Number 3   Number of Visits 8   Date for PT Re-Evaluation 09/09/15   PT Start Time 1430   PT Stop Time 1522   PT Time Calculation (min) 52 min      Past Medical History  Diagnosis Date  . Hypertension   . Hypothyroidism   . Lateral meniscal tear     right  . Vein symptom     patients reports vein behind left leg at knee hurting at times for last week  . Arthritis     Past Surgical History  Procedure Laterality Date  . Bladder tach  1980's  . Abdominal hysterectomy  1980's    complete  . Colonscopy    . Back surgery  2010  . Knee arthroscopy Right 03/12/2015    Procedure: ARTHROSCOPY RIGHT KNEE WITH LATERAL MENISCAL DEBRIDEMENT;  Surgeon: Gaynelle Arabian, MD;  Location: [facility];  Service: Orthopedics;  Laterality: Right;  . Total knee arthroplasty Right 07/14/2015    Procedure: TOTAL RIGHT KNEE ARTHROPLASTY;  Surgeon: Gaynelle Arabian, MD;  Location: [facility];  Service: Orthopedics;  Laterality: Right;    There were no vitals filed for this visit.  Visit Diagnosis:  Right knee pain  Knee stiffness, right      Subjective Assessment - 08/14/15 1432    Subjective Went to the MD and they said I might have strained the ligament, but everything is ok   Limitations Walking   Currently in Pain? Yes   Pain Score 4    Pain Location Knee   Pain Orientation Left   Pain Descriptors / Indicators Sore   Pain Frequency Constant   Aggravating Factors  sitting/ standing too long   Pain Relieving Factors ICE, estim            [facility] PT Assessment - 08/14/15 0001    AROM   Overall AROM Comments -5 degrees to full passive  right knee extension with active flexion= 118 degrees and passive= 120 degrees.                     [facility] Adult PT Treatment/Exercise - 08/14/15 0001    Exercises   Exercises Knee/Hip   Knee/Hip Exercises: Aerobic   Nustep Nustep L4 x 15 mins, seat   Knee/Hip Exercises: Standing   Forward Step Up Right;3 sets;10 reps;Hand Hold: 2   Rocker Board 3 minutes  calf stretching and balance   Knee/Hip Exercises: Seated   Long Arc Quad --  2#   Long Arc Quad [employer] --   Contractor Location Right knee.IFC x 15 min 80-150 hz    Electrical Stimulation Goals Edema;Pain   Vasopneumatic   Number Minutes Vasopneumatic  15 minutes   Vasopnuematic Location  Knee   Vasopneumatic Pressure Medium   Vasopneumatic Temperature  34   Manual Therapy   Manual Therapy Soft tissue mobilization   Soft tissue mobilization Scar massage                  PT Short Term Goals - 08/05/15 1525  PT SHORT TERM GOAL #1   Title Ind with HEP.   Time 2   Period Weeks   Status New   PT SHORT TERM GOAL #2   Title Full active right knee extension.   Time 2   Period Weeks   Status New           PT Long Term Goals - 08/05/15 1526    PT LONG TERM GOAL #1   Title Active right knee flexion to 125 degrees+ so the patient can perform functional tasks and do so with pain not > 2-3/10.   Time 4   Period Weeks   Status New   PT LONG TERM GOAL #2   Title 5/5 right knee strength.   Time 4   Period Weeks   Status New   PT LONG TERM GOAL #3   Title Perform a reciprocating stair gait with one railing with pain not > 2-3/10.   Time 4   Period Weeks   Status New   PT LONG TERM GOAL #4   Title Perforrm ADL's with pain not > 3/10.   Time 4   Period Weeks   Status New               Plan - 08/14/15 1435    Clinical Impression Statement Pt did great today and was able to perform Rt  Exs and Act.'s with minimal pain increase. Her ROM continues to increase  (5-118 degrees today). Her scar is a little tight mid and lower aspects. Goals are on-going Pt currently doesn't use an AD when ambulating, but tends to have a LOB at times   Pt will benefit from skilled therapeutic intervention in order to improve on the following deficits Pain;Decreased activity tolerance;Decreased range of motion   Rehab Potential Excellent   PT Frequency 2x / week   PT Duration 4 weeks   PT Treatment/Interventions Electrical Stimulation;Vasopneumatic Device;Therapeutic exercise;Therapeutic activities;Manual techniques;ADLs/Self Care Home Management;Passive range of motion   PT Next Visit Plan Right total knee replacement protocol.  Electrical stimulation and vasopneumatic.   Consulted and Agree with Plan of Care Patient        Problem List Patient Active Problem List   Diagnosis Date Noted  . OA (osteoarthritis) of knee 07/14/2015  . Lateral meniscal tear 03/12/2015    RAMSEUR,CHRIS, PTA 08/14/2015, 3:36 PM  [facility] [place], [place], [address] Phone: [phone]   Fax:  [phone]  Name: Sharon Gordon MRN: [id number] Date of Birth: 19-Mar-1928

## 2015-08-21 ENCOUNTER — Ambulatory Visit: Payer: Medicare Other | Admitting: Physical Therapy

## 2015-08-21 DIAGNOSIS — M25561 Pain in right knee: Secondary | ICD-10-CM

## 2015-08-21 DIAGNOSIS — M25661 Stiffness of right knee, not elsewhere classified: Secondary | ICD-10-CM

## 2015-08-21 NOTE — Therapy (Signed)
[facility] [place], [place], [address] Phone: [phone]   Fax:  [phone]  Physical Therapy Treatment  Patient Details  Name: Sharon Gordon MRN: [id number] Date of Birth: [birth year] Referring Provider: Gaynelle Arabian MD.  Encounter Date: 08/21/2015      PT End of Session - 08/21/15 1813    Visit Number 4   Number of Visits 8   Date for PT Re-Evaluation 09/09/15   PT Start Time 0230   PT Stop Time 0322   PT Time Calculation (min) 52 min   Activity Tolerance Patient tolerated treatment well   Behavior During Therapy [facility] for tasks assessed/performed      Past Medical History  Diagnosis Date  . Hypertension   . Hypothyroidism   . Lateral meniscal tear     right  . Vein symptom     patients reports vein behind left leg at knee hurting at times for last week  . Arthritis     Past Surgical History  Procedure Laterality Date  . Bladder tach  1980's  . Abdominal hysterectomy  1980's    complete  . Colonscopy    . Back surgery  2010  . Knee arthroscopy Right 03/12/2015    Procedure: ARTHROSCOPY RIGHT KNEE WITH LATERAL MENISCAL DEBRIDEMENT;  Surgeon: Gaynelle Arabian, MD;  Location: [facility];  Service: Orthopedics;  Laterality: Right;  . Total knee arthroplasty Right 07/14/2015    Procedure: TOTAL RIGHT KNEE ARTHROPLASTY;  Surgeon: Gaynelle Arabian, MD;  Location: [facility];  Service: Orthopedics;  Laterality: Right;    There were no vitals filed for this visit.  Visit Diagnosis:  Right knee pain  Knee stiffness, right      Subjective Assessment - 08/21/15 1810    Subjective I'm pleased and the doctor said it would be my last day.   Pain Score 4    Pain Location Knee   Pain Orientation Left   Pain Descriptors / Indicators Sore   Pain Frequency Constant                         [facility] Adult PT Treatment/Exercise - 08/21/15 0001    Knee/Hip Exercises: Aerobic   Nustep Nustep level 5 x 15 minutes.    Knee/Hip Exercises: Machines for Strengthening   Cybex Knee Extension 10# x 5 minutes.   Cybex Knee Flexion 30# x 5 miunutes.   Vasopneumatic   Number Minutes Vasopneumatic  15 minutes   Vasopnuematic Location  --  Right knee.   Vasopneumatic Pressure Medium                  PT Short Term Goals - 08/05/15 1525    PT SHORT TERM GOAL #1   Title Ind with HEP.   Time 2   Period Weeks   Status New   PT SHORT TERM GOAL #2   Title Full active right knee extension.   Time 2   Period Weeks   Status New           PT Long Term Goals - 08/21/15 1814    PT LONG TERM GOAL #1   Title Active right knee flexion to 125 degrees+ so the patient can perform functional tasks and do so with pain not > 2-3/10.   Status Not Met  118 degrees.   PT LONG TERM GOAL #2   Title 5/5 right knee strength.   Status Achieved   PT LONG TERM GOAL #3  Title Perform a reciprocating stair gait with one railing with pain not > 2-3/10.   Status Partially Met   PT LONG TERM GOAL #4   Title Perforrm ADL's with pain not > 3/10.   Status Achieved               Plan - 2015-08-28 1814    Clinical Impression Statement Great job overall.  Surgeon discharging patient at this time.   Pt will benefit from skilled therapeutic intervention in order to improve on the following deficits Pain;Decreased activity tolerance;Decreased range of motion   Rehab Potential Excellent   Consulted and Agree with Plan of Care Patient          G-Codes - 08/28/2015 1815    Functional Assessment Tool Used Foto.   Functional Limitation Mobility: Walking and moving around   Mobility: Walking and Moving Around Current Status [phone]) At least 40 percent but less than 60 percent impaired, limited or restricted   Mobility: Walking and Moving Around Goal Status [phone]) At least 1 percent but less than 20 percent impaired, limited or restricted   Mobility: Walking and Moving Around Discharge Status [phone]) At least 40  percent but less than 60 percent impaired, limited or restricted      Problem List Patient Active Problem List   Diagnosis Date Noted  . OA (osteoarthritis) of knee 07/14/2015  . Lateral meniscal tear 03/12/2015   PHYSICAL THERAPY DISCHARGE SUMMARY  Visits from Start of Care: 4  Current functional level related to goals / functional outcomes: Please see above.   Remaining deficits: Still lacking some ROM but otherwise doing great.   Education / Equipment: HEP  Plan: Patient agrees to discharge.  Patient goals were partially met. Patient is being discharged due to meeting the stated rehab goals.  ?????       Abiel Antrim, [place] MPT 28-Aug-2015, 6:18 PM  [facility] [address] [place], [place], [address] Phone: [phone]   Fax:  [phone]  Name: Sharon Gordon MRN: [id number] Date of Birth: 06-10-1928

## 2016-04-26 ENCOUNTER — Ambulatory Visit: Payer: Self-pay | Admitting: Orthopedic Surgery

## 2016-05-07 NOTE — Patient Instructions (Signed)
Bryttany Cervin  05/07/2016   Your procedure is scheduled on: 05/17/2016    Report to [facility] Main  Entrance take [address] [place]  elevators to 3rd floor to  [place] at    [place] AM.  Call this number if you have problems the morning of surgery [phone]   Remember: ONLY 1 PERSON MAY GO WITH YOU TO SHORT STAY TO GET  READY MORNING OF YOUR SURGERY.  Do not eat food or drink liquids :After Midnight.     Take these medicines the morning of surgery with A SIP OF WATER: Cymbalta, synthroid                                 You may not have any metal on your body including hair pins and              piercings  Do not wear jewelry, make-up, lotions, powders or perfumes, deodorant             Do not wear nail polish.  Do not shave  48 hours prior to surgery.     Do not bring valuables to the hospital. [place].  Contacts, dentures or bridgework may not be worn into surgery.  Leave suitcase in the car. After surgery it may be brought to your room.       :  Special Instructions: coughing and deep breathing exercises, leg exercises               Please read over the following fact sheets you were given: _____________________________________________________________________             [facility] - Preparing for Surgery Before surgery, you can play an important role.  Because skin is not sterile, your skin needs to be as free of germs as possible.  You can reduce the number of germs on your skin by washing with CHG (chlorahexidine gluconate) soap before surgery.  CHG is an antiseptic cleaner which kills germs and bonds with the skin to continue killing germs even after washing. Please DO NOT use if you have an allergy to CHG or antibacterial soaps.  If your skin becomes reddened/irritated stop using the CHG and inform your nurse when you arrive at Short Stay. Do not shave (including legs and underarms) for at least 48  hours prior to the first CHG shower.  You may shave your face/neck. Please follow these instructions carefully:  1.  Shower with CHG Soap the night before surgery and the  morning of Surgery.  2.  If you choose to wash your hair, wash your hair first as usual with your  normal  shampoo.  3.  After you shampoo, rinse your hair and body thoroughly to remove the  shampoo.                           4.  Use CHG as you would any other liquid soap.  You can apply chg directly  to the skin and wash                       Gently with a scrungie or clean washcloth.  5.  Apply  the CHG Soap to your body ONLY FROM THE NECK DOWN.   Do not use on face/ open                           Wound or open sores. Avoid contact with eyes, ears mouth and genitals (private parts).                       Wash face,  Genitals (private parts) with your normal soap.             6.  Wash thoroughly, paying special attention to the area where your surgery  will be performed.  7.  Thoroughly rinse your body with warm water from the neck down.  8.  DO NOT shower/wash with your normal soap after using and rinsing off  the CHG Soap.                9.  Pat yourself dry with a clean towel.            10.  Wear clean pajamas.            11.  Place clean sheets on your bed the night of your first shower and do not  sleep with pets. Day of Surgery : Do not apply any lotions/deodorants the morning of surgery.  Please wear clean clothes to the hospital/surgery center.  FAILURE TO FOLLOW THESE INSTRUCTIONS MAY RESULT IN THE CANCELLATION OF YOUR SURGERY PATIENT SIGNATURE_________________________________  NURSE SIGNATURE__________________________________  ________________________________________________________________________  WHAT IS A BLOOD TRANSFUSION? Blood Transfusion Information  A transfusion is the replacement of blood or some of its parts. Blood is made up of multiple cells which provide different functions.  Red blood cells  carry oxygen and are used for blood loss replacement.  White blood cells fight against infection.  Platelets control bleeding.  Plasma helps clot blood.  Other blood products are available for specialized needs, such as hemophilia or other clotting disorders. BEFORE THE TRANSFUSION  Who gives blood for transfusions?   Healthy volunteers who are fully evaluated to make sure their blood is safe. This is blood bank blood. Transfusion therapy is the safest it has ever been in the practice of medicine. Before blood is taken from a donor, a complete history is taken to make sure that person has no history of diseases nor engages in risky social behavior (examples are intravenous drug use or sexual activity with multiple partners). The donor's travel history is screened to minimize risk of transmitting infections, such as malaria. The donated blood is tested for signs of infectious diseases, such as HIV and hepatitis. The blood is then tested to be sure it is compatible with you in order to minimize the chance of a transfusion reaction. If you or a relative donates blood, this is often done in anticipation of surgery and is not appropriate for emergency situations. It takes many days to process the donated blood. RISKS AND COMPLICATIONS Although transfusion therapy is very safe and saves many lives, the main dangers of transfusion include:   Getting an infectious disease.  Developing a transfusion reaction. This is an allergic reaction to something in the blood you were given. Every precaution is taken to prevent this. The decision to have a blood transfusion has been considered carefully by your caregiver before blood is given. Blood is not given unless the benefits outweigh the risks. AFTER THE TRANSFUSION  Right after receiving a blood  transfusion, you will usually feel much better and more energetic. This is especially true if your red blood cells have gotten low (anemic). The transfusion raises  the level of the red blood cells which carry oxygen, and this usually causes an energy increase.  The nurse administering the transfusion will monitor you carefully for complications. HOME CARE INSTRUCTIONS  No special instructions are needed after a transfusion. You may find your energy is better. Speak with your caregiver about any limitations on activity for underlying diseases you may have. SEEK MEDICAL CARE IF:   Your condition is not improving after your transfusion.  You develop redness or irritation at the intravenous (IV) site. SEEK IMMEDIATE MEDICAL CARE IF:  Any of the following symptoms occur over the next 12 hours:  Shaking chills.  You have a temperature by mouth above 102 F (38.9 C), not controlled by medicine.  Chest, back, or muscle pain.  People around you feel you are not acting correctly or are confused.  Shortness of breath or difficulty breathing.  Dizziness and fainting.  You get a rash or develop hives.  You have a decrease in urine output.  Your urine turns a dark color or changes to pink, red, or brown. Any of the following symptoms occur over the next 10 days:  You have a temperature by mouth above 102 F (38.9 C), not controlled by medicine.  Shortness of breath.  Weakness after normal activity.  The white part of the eye turns yellow (jaundice).  You have a decrease in the amount of urine or are urinating less often.  Your urine turns a dark color or changes to pink, red, or brown. Document Released: 09/17/2000 Document Revised: 12/13/2011 Document Reviewed: 05/06/2008 ExitCare Patient Information 2014 [place].  _______________________________________________________________________  Incentive Spirometer  An incentive spirometer is a tool that can help keep your lungs clear and active. This tool measures how well you are filling your lungs with each breath. Taking long deep breaths may help reverse or decrease the chance of  developing breathing (pulmonary) problems (especially infection) following:  A long period of time when you are unable to move or be active. BEFORE THE PROCEDURE   If the spirometer includes an indicator to show your best effort, your nurse or respiratory therapist will set it to a desired goal.  If possible, sit up straight or lean slightly forward. Try not to slouch.  Hold the incentive spirometer in an upright position. INSTRUCTIONS FOR USE  1. Sit on the edge of your bed if possible, or sit up as far as you can in bed or on a chair. 2. Hold the incentive spirometer in an upright position. 3. Breathe out normally. 4. Place the mouthpiece in your mouth and seal your lips tightly around it. 5. Breathe in slowly and as deeply as possible, raising the piston or the ball toward the top of the column. 6. Hold your breath for 3-5 seconds or for as long as possible. Allow the piston or ball to fall to the bottom of the column. 7. Remove the mouthpiece from your mouth and breathe out normally. 8. Rest for a few seconds and repeat Steps 1 through 7 at least 10 times every 1-2 hours when you are awake. Take your time and take a few normal breaths between deep breaths. 9. The spirometer may include an indicator to show your best effort. Use the indicator as a goal to work toward during each repetition. 10. After each set of 10 deep  breaths, practice coughing to be sure your lungs are clear. If you have an incision (the cut made at the time of surgery), support your incision when coughing by placing a pillow or rolled up towels firmly against it. Once you are able to get out of bed, walk around indoors and cough well. You may stop using the incentive spirometer when instructed by your caregiver.  RISKS AND COMPLICATIONS  Take your time so you do not get dizzy or light-headed.  If you are in pain, you may need to take or ask for pain medication before doing incentive spirometry. It is harder to take a  deep breath if you are having pain. AFTER USE  Rest and breathe slowly and easily.  It can be helpful to keep track of a log of your progress. Your caregiver can provide you with a simple table to help with this. If you are using the spirometer at home, follow these instructions: [place] IF:   You are having difficultly using the spirometer.  You have trouble using the spirometer as often as instructed.  Your pain medication is not giving enough relief while using the spirometer.  You develop fever of 100.5 F (38.1 C) or higher. SEEK IMMEDIATE MEDICAL CARE IF:   You cough up bloody sputum that had not been present before.  You develop fever of 102 F (38.9 C) or greater.  You develop worsening pain at or near the incision site. MAKE SURE YOU:   Understand these instructions.  Will watch your condition.  Will get help right away if you are not doing well or get worse. Document Released: 01/31/2007 Document Revised: 12/13/2011 Document Reviewed: 04/03/2007 [facility] Patient Information 2014 [place], [place].   ________________________________________________________________________

## 2016-05-10 ENCOUNTER — Encounter (HOSPITAL_COMMUNITY)
Admission: RE | Admit: 2016-05-10 | Discharge: 2016-05-10 | Disposition: A | Discharge status: Home / Self Care | Payer: Medicare Other | Source: Ambulatory Visit | Attending: Orthopedic Surgery | Admitting: Orthopedic Surgery

## 2016-05-10 ENCOUNTER — Encounter (HOSPITAL_COMMUNITY): Payer: Self-pay

## 2016-05-10 DIAGNOSIS — Z01818 Encounter for other preprocedural examination: Secondary | ICD-10-CM | POA: Diagnosis present

## 2016-05-10 DIAGNOSIS — M1712 Unilateral primary osteoarthritis, left knee: Secondary | ICD-10-CM | POA: Insufficient documentation

## 2016-05-10 DIAGNOSIS — R9431 Abnormal electrocardiogram [ECG] [EKG]: Secondary | ICD-10-CM | POA: Diagnosis not present

## 2016-05-10 DIAGNOSIS — Z0183 Encounter for blood typing: Secondary | ICD-10-CM | POA: Insufficient documentation

## 2016-05-10 DIAGNOSIS — Z01812 Encounter for preprocedural laboratory examination: Secondary | ICD-10-CM | POA: Diagnosis not present

## 2016-05-10 DIAGNOSIS — I447 Left bundle-branch block, unspecified: Secondary | ICD-10-CM | POA: Insufficient documentation

## 2016-05-10 HISTORY — DX: Malignant (primary) neoplasm, unspecified: C80.1

## 2016-05-10 LAB — CBC
HCT: 35.4 % — ABNORMAL LOW (ref 36.0–46.0)
Hemoglobin: 11.6 g/dL — ABNORMAL LOW (ref 12.0–15.0)
MCH: 33 pg (ref 26.0–34.0)
MCHC: 32.8 g/dL (ref 30.0–36.0)
MCV: 100.9 fL — ABNORMAL HIGH (ref 78.0–100.0)
PLATELETS: 331 10*3/uL (ref 150–400)
RBC: 3.51 MIL/uL — ABNORMAL LOW (ref 3.87–5.11)
RDW: 14.2 % (ref 11.5–15.5)
WBC: 5.8 10*3/uL (ref 4.0–10.5)

## 2016-05-10 LAB — COMPREHENSIVE METABOLIC PANEL
ALK PHOS: 58 U/L (ref 38–126)
ALT: 16 U/L (ref 14–54)
ANION GAP: 8 (ref 5–15)
AST: 17 U/L (ref 15–41)
Albumin: 3.9 g/dL (ref 3.5–5.0)
BUN: 47 mg/dL — ABNORMAL HIGH (ref 6–20)
CALCIUM: 9.2 mg/dL (ref 8.9–10.3)
CHLORIDE: 101 mmol/L (ref 101–111)
CO2: 28 mmol/L (ref 22–32)
Creatinine, Ser: 1.48 mg/dL — ABNORMAL HIGH (ref 0.44–1.00)
GFR calc non Af Amer: 31 mL/min — ABNORMAL LOW (ref >60)
GFR, EST AFRICAN AMERICAN: 36 mL/min — AB (ref >60)
Glucose, Bld: 94 mg/dL (ref 65–99)
POTASSIUM: 4.3 mmol/L (ref 3.5–5.1)
SODIUM: 137 mmol/L (ref 135–145)
Total Bilirubin: 0.6 mg/dL (ref 0.3–1.2)
Total Protein: 6.7 g/dL (ref 6.5–8.1)

## 2016-05-10 LAB — URINALYSIS, ROUTINE W REFLEX MICROSCOPIC
Bilirubin Urine: NEGATIVE
GLUCOSE, UA: NEGATIVE mg/dL
Ketones, ur: NEGATIVE mg/dL
Nitrite: NEGATIVE
PROTEIN: NEGATIVE mg/dL
Specific Gravity, Urine: 1.008 (ref 1.005–1.030)
pH: 6 (ref 5.0–8.0)

## 2016-05-10 LAB — URINE MICROSCOPIC-ADD ON

## 2016-05-10 LAB — APTT: aPTT: 29 seconds (ref 24–36)

## 2016-05-10 LAB — PROTIME-INR
INR: 0.97
Prothrombin Time: 12.9 seconds (ref 11.4–15.2)

## 2016-05-10 LAB — SURGICAL PCR SCREEN
MRSA, PCR: INVALID — AB
Staphylococcus aureus: INVALID — AB

## 2016-05-10 NOTE — Progress Notes (Addendum)
U/A and micro results done 05/10/16 faxed via EPIC to DR Aluisio. CMp done 05/17/2016 faxed via EPIC to Dr Wynelle Link.

## 2016-05-10 NOTE — Progress Notes (Signed)
Final EKG done 05/10/16 in [facility]

## 2016-05-12 LAB — MRSA CULTURE

## 2016-05-13 NOTE — H&P (Signed)
TOTAL KNEE ADMISSION H&P  Patient is being admitted for left total knee arthroplasty.  Subjective:  Chief Complaint:left knee pain.  HPI: Sharon Gordon, 82 y.o. female, has a history of pain and functional disability in the left knee due to arthritis and has failed non-surgical conservative treatments for greater than 12 weeks to includeNSAID's and/or analgesics, corticosteriod injections, use of assistive devices and activity modification.  Onset of symptoms was gradual, starting 5 years ago with gradually worsening course since that time. The patient noted no past surgery on the left knee(s).  Patient currently rates pain in the left knee(s) at 8 out of 10 with activity. Patient has night pain, worsening of pain with activity and weight bearing, pain that interferes with activities of daily living, pain with passive range of motion, crepitus and joint swelling.  Patient has evidence of periarticular osteophytes and joint space narrowing by imaging studies.  There is no active infection.  Patient Active Problem List   Diagnosis Date Noted  . OA (osteoarthritis) of knee 07/14/2015  . Lateral meniscal tear 03/12/2015   Past Medical History:  Diagnosis Date  . Arthritis   . Cancer ([place])    skin cancer on nose removed   . Hypertension   . Hypothyroidism   . Lateral meniscal tear    right  . Vein symptom    patients reports vein behind left leg at knee hurting at times for last week    Past Surgical History:  Procedure Laterality Date  . ABDOMINAL HYSTERECTOMY  1980's   complete  . BACK SURGERY  2010  . bladder tach  1980's  . colonscopy    . KNEE ARTHROSCOPY Right 03/12/2015   Procedure: ARTHROSCOPY RIGHT KNEE WITH LATERAL MENISCAL DEBRIDEMENT;  Surgeon: Gaynelle Arabian, MD;  Location: [facility] OR[facility];  Service: Orthopedics;  Laterality: Right;  . skin cancer on nose removal     . TOTAL KNEE ARTHROPLASTY Right 07/14/2015   Procedure: TOTAL RIGHT KNEE ARTHROPLASTY;  Surgeon: Gaynelle Arabian, MD;   Location: [facility];  Service: Orthopedics;  Laterality: Right;      Current Outpatient Prescriptions:  .  amLODipine (NORVASC) 5 MG tablet, Take 5 mg by mouth every evening., Disp: , Rfl:  .  cholecalciferol (VITAMIN D) 1000 units tablet, Take 1,000 Units by mouth daily., Disp: , Rfl:  .  diclofenac (VOLTAREN) 75 MG EC tablet, Take 75 mg by mouth 2 (two) times daily., Disp: , Rfl:  .  DULoxetine (CYMBALTA) 30 MG capsule, Take 30 mg by mouth every morning. , Disp: , Rfl:  .  gabapentin (NEURONTIN) 300 MG capsule, Take 600 mg by mouth 3 (three) times daily., Disp: , Rfl:  .  levothyroxine (SYNTHROID, LEVOTHROID) 50 MCG tablet, Take 50 mcg by mouth daily before breakfast., Disp: , Rfl:  .  lisinopril-hydrochlorothiazide (PRINZIDE,ZESTORETIC) 20-12.5 MG per tablet, Take 1 tablet by mouth daily. , Disp: , Rfl:  .  Multiple Vitamin (MULTIVITAMIN WITH MINERALS) TABS tablet, Take 1 tablet by mouth daily., Disp: , Rfl:  .  Omega-3 Fatty Acids (FISH OIL) 1000 MG CAPS, Take 1,000 mg by mouth daily., Disp: , Rfl:   No Known Allergies  Social History  Substance Use Topics  . Smoking status: Never Smoker  . Smokeless tobacco: Never Used  . Alcohol use No      Review of Systems  Constitutional: Negative.   HENT: Negative.   Eyes: Negative.   Respiratory: Negative.   Cardiovascular: Negative.   Gastrointestinal: Negative.   Genitourinary: Positive for  frequency. Negative for dysuria, flank pain, hematuria and urgency.  Musculoskeletal: Positive for joint pain and myalgias. Negative for back pain, falls and neck pain.  Skin: Negative.   Neurological: Negative.   Endo/Heme/Allergies: Negative.   Psychiatric/Behavioral: Negative.     Objective:  Physical Exam  Constitutional: She appears well-developed and well-nourished. No distress.  HENT:  Head: Normocephalic and atraumatic.  Right Ear: External ear normal.  Left Ear: External ear normal.  Nose: Nose normal.  Mouth/Throat: Oropharynx is  clear and moist.  Eyes: Conjunctivae and EOM are normal.  Neck: Normal range of motion. Neck supple.  Cardiovascular: Normal rate, regular rhythm, normal heart sounds and intact distal pulses.   No murmur heard. Respiratory: Effort normal and breath sounds normal. No respiratory distress. She has no wheezes.  GI: Bowel sounds are normal. She exhibits no distension. There is no tenderness.  Musculoskeletal:       Right hip: Normal.       Left hip: Normal.       Right knee: Normal.       Left knee: She exhibits decreased range of motion and swelling. She exhibits no effusion and no erythema. Tenderness found. Medial joint line and lateral joint line tenderness noted.  ROM in left knee 5-120  Neurological: She has normal strength. No sensory deficit.  Skin: No rash noted. She is not diaphoretic. No erythema.  Psychiatric: She has a normal mood and affect.    Vitals  Weight: 130 lb Height: 64in Body Surface Area: 1.63 m Body Mass Index: 22.31 kg/m  Pulse: 76 (Regular)  BP: 142/78 (Sitting, Left Arm, Standard)   Imaging Review Plain radiographs demonstrate severe degenerative joint disease of the left knee(s). The overall alignment ismild valgus. The bone quality appears to be good for age and reported activity level.  Assessment/Plan:  End stage primary osteoarthritis, left knee   The patient history, physical examination, clinical judgment of the provider and imaging studies are consistent with end stage degenerative joint disease of the left knee(s) and total knee arthroplasty is deemed medically necessary. The treatment options including medical management, injection therapy arthroscopy and arthroplasty were discussed at length. The risks and benefits of total knee arthroplasty were presented and reviewed. The risks due to aseptic loosening, infection, stiffness, patella tracking problems, thromboembolic complications and other imponderables were discussed. The patient  acknowledged the explanation, agreed to proceed with the plan and consent was signed. Patient is being admitted for inpatient treatment for surgery, pain control, PT, OT, prophylactic antibiotics, VTE prophylaxis, progressive ambulation and ADL's and discharge planning. The patient is planning to be discharged home with home health services   PCP: Dr. Marina Gravel, PA-C

## 2016-05-17 ENCOUNTER — Encounter (HOSPITAL_COMMUNITY): Payer: Self-pay

## 2016-05-17 ENCOUNTER — Inpatient Hospital Stay (HOSPITAL_COMMUNITY)
Admission: RE | Admit: 2016-05-17 | Discharge: 2016-05-19 | DRG: 470 | Disposition: A | Discharge status: Home Health | Payer: Medicare Other | Source: Ambulatory Visit | Attending: Orthopedic Surgery | Admitting: Orthopedic Surgery

## 2016-05-17 ENCOUNTER — Inpatient Hospital Stay (HOSPITAL_COMMUNITY): Payer: Medicare Other | Admitting: Certified Registered"

## 2016-05-17 ENCOUNTER — Encounter (HOSPITAL_COMMUNITY)
Admission: RE | Disposition: A | Discharge status: Home Health | Payer: Self-pay | Source: Ambulatory Visit | Attending: Orthopedic Surgery

## 2016-05-17 DIAGNOSIS — Z96651 Presence of right artificial knee joint: Secondary | ICD-10-CM | POA: Diagnosis present

## 2016-05-17 DIAGNOSIS — I10 Essential (primary) hypertension: Secondary | ICD-10-CM | POA: Diagnosis present

## 2016-05-17 DIAGNOSIS — Z79899 Other long term (current) drug therapy: Secondary | ICD-10-CM | POA: Diagnosis not present

## 2016-05-17 DIAGNOSIS — M1710 Unilateral primary osteoarthritis, unspecified knee: Secondary | ICD-10-CM | POA: Diagnosis present

## 2016-05-17 DIAGNOSIS — Z90710 Acquired absence of both cervix and uterus: Secondary | ICD-10-CM | POA: Diagnosis not present

## 2016-05-17 DIAGNOSIS — M1712 Unilateral primary osteoarthritis, left knee: Secondary | ICD-10-CM | POA: Diagnosis present

## 2016-05-17 DIAGNOSIS — E039 Hypothyroidism, unspecified: Secondary | ICD-10-CM | POA: Diagnosis present

## 2016-05-17 DIAGNOSIS — M179 Osteoarthritis of knee, unspecified: Secondary | ICD-10-CM | POA: Diagnosis present

## 2016-05-17 DIAGNOSIS — M25562 Pain in left knee: Secondary | ICD-10-CM | POA: Diagnosis present

## 2016-05-17 DIAGNOSIS — I9581 Postprocedural hypotension: Secondary | ICD-10-CM | POA: Diagnosis not present

## 2016-05-17 HISTORY — PX: TOTAL KNEE ARTHROPLASTY: SHX125

## 2016-05-17 LAB — TYPE AND SCREEN
ABO/RH(D): A POS
Antibody Screen: NEGATIVE

## 2016-05-17 SURGERY — ARTHROPLASTY, KNEE, TOTAL
Anesthesia: General | Site: Knee | Laterality: Left

## 2016-05-17 MED ORDER — ONDANSETRON HCL 4 MG/2ML IJ SOLN
INTRAMUSCULAR | Status: DC | PRN
  Administered 2016-05-17: 4 mg via INTRAVENOUS

## 2016-05-17 MED ORDER — DEXAMETHASONE SODIUM PHOSPHATE 10 MG/ML IJ SOLN
10.0000 mg | Freq: Once | INTRAMUSCULAR | Status: AC
  Administered 2016-05-17: 10 mg via INTRAVENOUS

## 2016-05-17 MED ORDER — AMLODIPINE BESYLATE 5 MG PO TABS
5.0000 mg | ORAL_TABLET | Freq: Every evening | ORAL | Status: DC
  Administered 2016-05-18: 5 mg via ORAL
  Filled 2016-05-17 (×2): qty 1

## 2016-05-17 MED ORDER — 0.9 % SODIUM CHLORIDE (POUR BTL) OPTIME
TOPICAL | Status: DC | PRN
  Administered 2016-05-17: 1000 mL

## 2016-05-17 MED ORDER — METHOCARBAMOL 1000 MG/10ML IJ SOLN: 500.0000 mg | Freq: Four times a day (QID) | INTRAVENOUS | Status: DC | PRN

## 2016-05-17 MED ORDER — MIDAZOLAM HCL 2 MG/2ML IJ SOLN
INTRAMUSCULAR | Status: AC
  Filled 2016-05-17: qty 2

## 2016-05-17 MED ORDER — LEVOTHYROXINE SODIUM 50 MCG PO TABS
50.0000 ug | ORAL_TABLET | Freq: Every day | ORAL | Status: DC
  Administered 2016-05-18 – 2016-05-19 (×2): 50 ug via ORAL
  Filled 2016-05-17 (×2): qty 1

## 2016-05-17 MED ORDER — MENTHOL 3 MG MT LOZG: 1.0000 | LOZENGE | OROMUCOSAL | Status: DC | PRN

## 2016-05-17 MED ORDER — LABETALOL HCL 5 MG/ML IV SOLN
INTRAVENOUS | Status: DC | PRN
  Administered 2016-05-17 (×3): 2.5 mg via INTRAVENOUS

## 2016-05-17 MED ORDER — ACETAMINOPHEN 325 MG PO TABS: 650.0000 mg | ORAL_TABLET | Freq: Four times a day (QID) | ORAL | Status: DC | PRN

## 2016-05-17 MED ORDER — LIDOCAINE HCL (CARDIAC) 20 MG/ML IV SOLN
INTRAVENOUS | Status: DC | PRN
  Administered 2016-05-17: 40 mg via INTRAVENOUS

## 2016-05-17 MED ORDER — CEFAZOLIN SODIUM-DEXTROSE 2-4 GM/100ML-% IV SOLN
2.0000 g | Freq: Four times a day (QID) | INTRAVENOUS | Status: AC
  Administered 2016-05-17 (×2): 2 g via INTRAVENOUS
  Filled 2016-05-17 (×2): qty 100

## 2016-05-17 MED ORDER — ACETAMINOPHEN 650 MG RE SUPP
650.0000 mg | Freq: Four times a day (QID) | RECTAL | Status: DC | PRN
Start: 2016-05-17 — End: 2016-05-19

## 2016-05-17 MED ORDER — BUPIVACAINE LIPOSOME 1.3 % IJ SUSP
INTRAMUSCULAR | Status: DC | PRN
  Administered 2016-05-17: 20 mL

## 2016-05-17 MED ORDER — PHENOL 1.4 % MT LIQD: 1.0000 | OROMUCOSAL | Status: DC | PRN

## 2016-05-17 MED ORDER — MORPHINE SULFATE (PF) 2 MG/ML IV SOLN
1.0000 mg | INTRAVENOUS | Status: DC | PRN
  Administered 2016-05-17: 1 mg via INTRAVENOUS
  Filled 2016-05-17: qty 1

## 2016-05-17 MED ORDER — PROPOFOL 10 MG/ML IV BOLUS
INTRAVENOUS | Status: DC | PRN
  Administered 2016-05-17: 40 mg via INTRAVENOUS
  Administered 2016-05-17: 100 mg via INTRAVENOUS
  Administered 2016-05-17: 40 mg via INTRAVENOUS
  Administered 2016-05-17: 100 mg via INTRAVENOUS

## 2016-05-17 MED ORDER — PROPOFOL 10 MG/ML IV BOLUS
INTRAVENOUS | Status: AC
  Filled 2016-05-17: qty 20

## 2016-05-17 MED ORDER — BUPIVACAINE HCL 0.25 % IJ SOLN
INTRAMUSCULAR | Status: DC | PRN
  Administered 2016-05-17: 20 mL

## 2016-05-17 MED ORDER — DEXAMETHASONE SODIUM PHOSPHATE 10 MG/ML IJ SOLN
10.0000 mg | Freq: Once | INTRAMUSCULAR | Status: AC
  Administered 2016-05-18: 10 mg via INTRAVENOUS
  Filled 2016-05-17: qty 1

## 2016-05-17 MED ORDER — SODIUM CHLORIDE 0.9 % IV SOLN
INTRAVENOUS | Status: DC
  Administered 2016-05-17 – 2016-05-18 (×2): via INTRAVENOUS

## 2016-05-17 MED ORDER — FENTANYL CITRATE (PF) 250 MCG/5ML IJ SOLN
INTRAMUSCULAR | Status: DC | PRN
  Administered 2016-05-17 (×2): 50 ug via INTRAVENOUS
  Administered 2016-05-17: 100 ug via INTRAVENOUS
  Administered 2016-05-17: 50 ug via INTRAVENOUS

## 2016-05-17 MED ORDER — ONDANSETRON HCL 4 MG PO TABS: 4.0000 mg | ORAL_TABLET | Freq: Four times a day (QID) | ORAL | Status: DC | PRN

## 2016-05-17 MED ORDER — DOCUSATE SODIUM 100 MG PO CAPS
100.0000 mg | ORAL_CAPSULE | Freq: Two times a day (BID) | ORAL | Status: DC
Start: 2016-05-17 — End: 2016-05-19
  Administered 2016-05-17 – 2016-05-19 (×5): 100 mg via ORAL
  Filled 2016-05-17 (×5): qty 1

## 2016-05-17 MED ORDER — METOCLOPRAMIDE HCL 5 MG PO TABS: 5.0000 mg | ORAL_TABLET | Freq: Three times a day (TID) | ORAL | Status: DC | PRN

## 2016-05-17 MED ORDER — DIPHENHYDRAMINE HCL 12.5 MG/5ML PO ELIX
12.5000 mg | ORAL_SOLUTION | ORAL | Status: DC | PRN
  Administered 2016-05-18: 25 mg via ORAL
  Filled 2016-05-17: qty 10

## 2016-05-17 MED ORDER — TRANEXAMIC ACID 1000 MG/10ML IV SOLN
1000.0000 mg | INTRAVENOUS | Status: AC
  Administered 2016-05-17: 1000 mg via INTRAVENOUS
  Filled 2016-05-17: qty 1100

## 2016-05-17 MED ORDER — METOCLOPRAMIDE HCL 5 MG/ML IJ SOLN: 5.0000 mg | Freq: Three times a day (TID) | INTRAMUSCULAR | Status: DC | PRN

## 2016-05-17 MED ORDER — PHENYLEPHRINE HCL 10 MG/ML IJ SOLN
INTRAMUSCULAR | Status: DC | PRN
  Administered 2016-05-17 (×2): 40 ug via INTRAVENOUS

## 2016-05-17 MED ORDER — ACETAMINOPHEN 10 MG/ML IV SOLN
INTRAVENOUS | Status: AC
  Filled 2016-05-17: qty 100

## 2016-05-17 MED ORDER — METHOCARBAMOL 500 MG PO TABS
500.0000 mg | ORAL_TABLET | Freq: Four times a day (QID) | ORAL | Status: DC | PRN
  Administered 2016-05-17 (×2): 500 mg via ORAL
  Filled 2016-05-17 (×2): qty 1

## 2016-05-17 MED ORDER — ROCURONIUM BROMIDE 100 MG/10ML IV SOLN
INTRAVENOUS | Status: DC | PRN
  Administered 2016-05-17: 40 mg via INTRAVENOUS

## 2016-05-17 MED ORDER — BISACODYL 10 MG RE SUPP: 10.0000 mg | Freq: Every day | RECTAL | Status: DC | PRN

## 2016-05-17 MED ORDER — SODIUM CHLORIDE 0.9 % IJ SOLN
INTRAMUSCULAR | Status: AC
  Filled 2016-05-17: qty 50

## 2016-05-17 MED ORDER — POLYETHYLENE GLYCOL 3350 17 G PO PACK: 17.0000 g | PACK | Freq: Every day | ORAL | Status: DC | PRN

## 2016-05-17 MED ORDER — ACETAMINOPHEN 10 MG/ML IV SOLN
1000.0000 mg | Freq: Once | INTRAVENOUS | Status: AC
  Administered 2016-05-17: 1000 mg via INTRAVENOUS

## 2016-05-17 MED ORDER — APIXABAN 2.5 MG PO TABS
2.5000 mg | ORAL_TABLET | Freq: Two times a day (BID) | ORAL | Status: DC
  Administered 2016-05-18 – 2016-05-19 (×3): 2.5 mg via ORAL
  Filled 2016-05-17 (×3): qty 1

## 2016-05-17 MED ORDER — BUPIVACAINE LIPOSOME 1.3 % IJ SUSP
20.0000 mL | Freq: Once | INTRAMUSCULAR | Status: DC
  Filled 2016-05-17: qty 20

## 2016-05-17 MED ORDER — ONDANSETRON HCL 4 MG/2ML IJ SOLN: 4.0000 mg | Freq: Once | INTRAMUSCULAR | Status: DC | PRN

## 2016-05-17 MED ORDER — BUPIVACAINE HCL (PF) 0.25 % IJ SOLN
INTRAMUSCULAR | Status: AC
  Filled 2016-05-17: qty 30

## 2016-05-17 MED ORDER — SODIUM CHLORIDE 0.9 % IR SOLN
Status: DC | PRN
  Administered 2016-05-17: 1000 mL

## 2016-05-17 MED ORDER — CEFAZOLIN SODIUM-DEXTROSE 2-4 GM/100ML-% IV SOLN
2.0000 g | INTRAVENOUS | Status: AC
  Administered 2016-05-17: 2 g via INTRAVENOUS

## 2016-05-17 MED ORDER — OXYCODONE HCL 5 MG PO TABS
5.0000 mg | ORAL_TABLET | ORAL | Status: DC | PRN
  Administered 2016-05-17: 5 mg via ORAL
  Administered 2016-05-17 (×3): 10 mg via ORAL
  Administered 2016-05-17: 5 mg via ORAL
  Administered 2016-05-18 – 2016-05-19 (×8): 10 mg via ORAL
  Administered 2016-05-19: 5 mg via ORAL
  Filled 2016-05-17: qty 1
  Filled 2016-05-17 (×11): qty 2
  Filled 2016-05-17: qty 1
  Filled 2016-05-17: qty 2

## 2016-05-17 MED ORDER — FENTANYL CITRATE (PF) 250 MCG/5ML IJ SOLN
INTRAMUSCULAR | Status: AC
  Filled 2016-05-17: qty 5

## 2016-05-17 MED ORDER — SUGAMMADEX SODIUM 200 MG/2ML IV SOLN
INTRAVENOUS | Status: DC | PRN
  Administered 2016-05-17: 150 mg via INTRAVENOUS

## 2016-05-17 MED ORDER — SODIUM CHLORIDE 0.9 % IJ SOLN
INTRAMUSCULAR | Status: DC | PRN
  Administered 2016-05-17: 30 mL

## 2016-05-17 MED ORDER — OXYCODONE HCL 5 MG PO TABS: 5.0000 mg | ORAL_TABLET | Freq: Once | ORAL | Status: DC | PRN

## 2016-05-17 MED ORDER — FLEET ENEMA 7-19 GM/118ML RE ENEM: 1.0000 | ENEMA | Freq: Once | RECTAL | Status: DC | PRN

## 2016-05-17 MED ORDER — OXYCODONE HCL 5 MG/5ML PO SOLN
5.0000 mg | Freq: Once | ORAL | Status: DC | PRN
  Filled 2016-05-17: qty 5

## 2016-05-17 MED ORDER — ONDANSETRON HCL 4 MG/2ML IJ SOLN: 4.0000 mg | Freq: Four times a day (QID) | INTRAMUSCULAR | Status: DC | PRN

## 2016-05-17 MED ORDER — TRANEXAMIC ACID 1000 MG/10ML IV SOLN
1000.0000 mg | Freq: Once | INTRAVENOUS | Status: AC
  Administered 2016-05-17: 1000 mg via INTRAVENOUS
  Filled 2016-05-17: qty 10

## 2016-05-17 MED ORDER — GABAPENTIN 300 MG PO CAPS
600.0000 mg | ORAL_CAPSULE | Freq: Three times a day (TID) | ORAL | Status: DC
Start: 2016-05-17 — End: 2016-05-19
  Administered 2016-05-17 – 2016-05-19 (×6): 600 mg via ORAL
  Filled 2016-05-17 (×6): qty 2

## 2016-05-17 MED ORDER — FENTANYL CITRATE (PF) 100 MCG/2ML IJ SOLN
25.0000 ug | INTRAMUSCULAR | Status: DC | PRN
  Administered 2016-05-17 (×2): 25 ug via INTRAVENOUS
  Administered 2016-05-17: 50 ug via INTRAVENOUS

## 2016-05-17 MED ORDER — CEFAZOLIN SODIUM-DEXTROSE 2-4 GM/100ML-% IV SOLN
INTRAVENOUS | Status: AC
  Filled 2016-05-17: qty 100

## 2016-05-17 MED ORDER — LACTATED RINGERS IV SOLN
INTRAVENOUS | Status: DC
  Administered 2016-05-17 (×2): via INTRAVENOUS

## 2016-05-17 MED ORDER — CHLORHEXIDINE GLUCONATE 4 % EX LIQD: 60.0000 mL | Freq: Once | CUTANEOUS | Status: DC

## 2016-05-17 MED ORDER — DULOXETINE HCL 30 MG PO CPEP
30.0000 mg | ORAL_CAPSULE | Freq: Every morning | ORAL | Status: DC
  Administered 2016-05-18 – 2016-05-19 (×2): 30 mg via ORAL
  Filled 2016-05-17 (×2): qty 1

## 2016-05-17 MED ORDER — FENTANYL CITRATE (PF) 100 MCG/2ML IJ SOLN
INTRAMUSCULAR | Status: AC
  Filled 2016-05-17: qty 2

## 2016-05-17 MED ORDER — STERILE WATER FOR IRRIGATION IR SOLN
Status: DC | PRN
  Administered 2016-05-17: 2000 mL

## 2016-05-17 SURGICAL SUPPLY — 50 items
BAG ZIPLOCK 12X15 (MISCELLANEOUS) ×2 IMPLANT
BANDAGE ACE 6X5 VEL STRL LF (GAUZE/BANDAGES/DRESSINGS) ×2 IMPLANT
BLADE SAG 18X100X1.27 (BLADE) ×2 IMPLANT
BLADE SAW SGTL 11.0X1.19X90.0M (BLADE) ×2 IMPLANT
BOWL SMART MIX CTS (DISPOSABLE) ×2 IMPLANT
CAP KNEE TOTAL 3 SIGMA ×2 IMPLANT
CEMENT HV SMART SET (Cement) ×4 IMPLANT
CLOTH BEACON ORANGE TIMEOUT ST (SAFETY) ×2 IMPLANT
CUFF TOURN SGL QUICK 34 (TOURNIQUET CUFF) ×1
CUFF TRNQT CYL 34X4X40X1 (TOURNIQUET CUFF) ×1 IMPLANT
DECANTER SPIKE VIAL GLASS SM (MISCELLANEOUS) ×2 IMPLANT
DRAPE U-SHAPE 47X51 STRL (DRAPES) ×2 IMPLANT
DRSG ADAPTIC 3X8 NADH LF (GAUZE/BANDAGES/DRESSINGS) ×2 IMPLANT
DRSG PAD ABDOMINAL 8X10 ST (GAUZE/BANDAGES/DRESSINGS) ×2 IMPLANT
DURAPREP 26ML APPLICATOR (WOUND CARE) ×2 IMPLANT
ELECT REM PT RETURN 9FT ADLT (ELECTROSURGICAL) ×2
ELECTRODE REM PT RTRN 9FT ADLT (ELECTROSURGICAL) ×1 IMPLANT
EVACUATOR 1/8 PVC DRAIN (DRAIN) ×2 IMPLANT
GAUZE SPONGE 4X4 12PLY STRL (GAUZE/BANDAGES/DRESSINGS) ×2 IMPLANT
GLOVE BIO SURGEON STRL SZ8 (GLOVE) ×4 IMPLANT
GLOVE BIOGEL PI IND STRL 6.5 (GLOVE) ×1 IMPLANT
GLOVE BIOGEL PI IND STRL 7.0 (GLOVE) ×3 IMPLANT
GLOVE BIOGEL PI IND STRL 7.5 (GLOVE) ×2 IMPLANT
GLOVE BIOGEL PI IND STRL 8 (GLOVE) ×1 IMPLANT
GLOVE BIOGEL PI INDICATOR 6.5 (GLOVE) ×1
GLOVE BIOGEL PI INDICATOR 7.0 (GLOVE) ×3
GLOVE BIOGEL PI INDICATOR 7.5 (GLOVE) ×2
GLOVE BIOGEL PI INDICATOR 8 (GLOVE) ×1
GLOVE SURG SS PI 6.5 STRL IVOR (GLOVE) ×2 IMPLANT
GLOVE SURG SS PI 7.0 STRL IVOR (GLOVE) ×2 IMPLANT
GOWN STRL REUS W/TWL LRG LVL3 (GOWN DISPOSABLE) ×4 IMPLANT
GOWN STRL REUS W/TWL XL LVL3 (GOWN DISPOSABLE) ×4 IMPLANT
HANDPIECE INTERPULSE COAX TIP (DISPOSABLE) ×1
IMMOBILIZER KNEE 20 (SOFTGOODS) ×2
IMMOBILIZER KNEE 20 THIGH 36 (SOFTGOODS) ×1 IMPLANT
MANIFOLD NEPTUNE II (INSTRUMENTS) ×2 IMPLANT
PACK TOTAL KNEE CUSTOM (KITS) ×2 IMPLANT
PAD ABD 8X10 STRL (GAUZE/BANDAGES/DRESSINGS) ×2 IMPLANT
PADDING CAST COTTON 6X4 STRL (CAST SUPPLIES) ×6 IMPLANT
POSITIONER SURGICAL ARM (MISCELLANEOUS) ×2 IMPLANT
SET HNDPC FAN SPRY TIP SCT (DISPOSABLE) ×1 IMPLANT
STRIP CLOSURE SKIN 1/2X4 (GAUZE/BANDAGES/DRESSINGS) ×4 IMPLANT
SUT MNCRL AB 4-0 PS2 18 (SUTURE) ×2 IMPLANT
SUT VIC AB 2-0 CT1 27 (SUTURE) ×3
SUT VIC AB 2-0 CT1 TAPERPNT 27 (SUTURE) ×3 IMPLANT
SUT VLOC 180 0 24IN GS25 (SUTURE) ×2 IMPLANT
SYR 50ML LL SCALE MARK (SYRINGE) ×2 IMPLANT
TRAY FOLEY W/METER SILVER 14FR (SET/KITS/TRAYS/PACK) ×2 IMPLANT
WRAP KNEE MAXI GEL POST OP (GAUZE/BANDAGES/DRESSINGS) ×2 IMPLANT
YANKAUER SUCT BULB TIP 10FT TU (MISCELLANEOUS) ×2 IMPLANT

## 2016-05-17 NOTE — Anesthesia Procedure Notes (Signed)
Procedure Name: Intubation Date/Time: 05/17/2016 7:19 AM Performed by: Cynda Familia Pre-anesthesia Checklist: Patient identified, Emergency Drugs available, Suction available and Patient being monitored Patient Re-evaluated:Patient Re-evaluated prior to inductionOxygen Delivery Method: Circle System Utilized Preoxygenation: Pre-oxygenation with 100% oxygen Intubation Type: IV induction Ventilation: Mask ventilation without difficulty Laryngoscope Size: Miller and 2 Grade View: Grade I Tube type: Oral Tube size: 7.0 mm Number of attempts: 1 Airway Equipment and Method: Stylet Placement Confirmation: ETT inserted through vocal cords under direct vision,  positive ETCO2 and breath sounds checked- equal and bilateral Secured at: 20 cm Tube secured with: Tape Dental Injury: Teeth and Oropharynx as per pre-operative assessment  Comments: Smooth IV induction Joslin--- intubation AM CRNA-- atraumatic---teeth and mouth as preop--  bilat BS Joslin

## 2016-05-17 NOTE — Anesthesia Postprocedure Evaluation (Signed)
Anesthesia Post Note  Patient: Sharon Gordon  Procedure(s) Performed: Procedure(s) (LRB): LEFT TOTAL KNEE ARTHROPLASTY (Left)  Patient location during evaluation: PACU Anesthesia Type: General Level of consciousness: awake, awake and alert and oriented Pain management: pain level controlled Vital Signs Assessment: post-procedure vital signs reviewed and stable Cardiovascular status: blood pressure returned to baseline Anesthetic complications: no    Last Vitals:  Vitals:   05/17/16 1050 05/17/16 1153  BP: (!) 154/70 (!) 154/81  Pulse: 76 81  Resp: 11 12  Temp:  36.5 C    Last Pain:  Vitals:   05/17/16 1153  TempSrc: Oral  PainSc:                  [name]

## 2016-05-17 NOTE — Transfer of Care (Signed)
Immediate Anesthesia Transfer of Care Note  Patient: Sharon Gordon  Procedure(s) Performed: Procedure(s): LEFT TOTAL KNEE ARTHROPLASTY (Left)  Patient Location: PACU  Anesthesia Type:General  Level of Consciousness: sedated  Airway & Oxygen Therapy: Patient Spontanous Breathing and Patient connected to face mask oxygen  Post-op Assessment: Report given to RN and Post -op Vital signs reviewed and stable  Post vital signs: Reviewed and stable  Last Vitals:  Vitals:   05/17/16 0541  BP: (!) 141/76  Pulse: 73  Resp: 16  Temp: 36.4 C    Last Pain:  Vitals:   05/17/16 0541  TempSrc: Oral  PainSc:          Complications: No apparent anesthesia complications

## 2016-05-17 NOTE — Evaluation (Signed)
Physical Therapy Evaluation Patient Details Name: Sharon Gordon MRN: [id number] DOB: 1928/02/24 Today's Date: 05/17/2016   History of Present Illness  Pt is an 82 year old female s/p L TKA with hx of R TKA 07/14/15 and back surgery  Clinical Impression  Pt is s/p L TKA resulting in the deficits listed below (see PT Problem List).  Pt will benefit from skilled PT to increase their independence and safety with mobility to allow discharge to the venue listed below.  Pt very agreeable to mobilize POD #0 and states, "I bet I'm the oldest patient you have today."  Pt plans to d/c home with family assist.     Follow Up Recommendations Home health PT;Supervision for mobility/OOB    Equipment Recommendations  None recommended by PT    Recommendations for Other Services       Precautions / Restrictions Precautions Precautions: Fall;Knee Required Braces or Orthoses: Knee Immobilizer - Left Restrictions Other Position/Activity Restrictions: WBAT      Mobility  Bed Mobility Overal bed mobility: Needs Assistance Bed Mobility: Supine to Sit     Supine to sit: Mod assist;HOB elevated     General bed mobility comments: assist for L LE and trunk upright  Transfers Overall transfer level: Needs assistance Equipment used: Rolling walker (2 wheeled) Transfers: Sit to/from Stand Sit to Stand: Min assist;From elevated surface         General transfer comment: verbal cues for UE and LE positioning, assist to rise and steady  Ambulation/Gait Ambulation/Gait assistance: Min assist Ambulation Distance (Feet): 60 Feet Assistive device: Rolling walker (2 wheeled) Gait Pattern/deviations: Step-to pattern;Antalgic     General Gait Details: verbal cues for sequence, RW positioning, posture, step length, slow but steady gait, denies dizziness  Stairs            Wheelchair Mobility    Modified Rankin (Stroke Patients Only)       Balance                                              Pertinent Vitals/Pain Pain Assessment: 0-10 Pain Score: 4  Pain Location: left knee Pain Descriptors / Indicators: Aching;Sore Pain Intervention(s): Limited activity within patient's tolerance;Repositioned;Ice applied;Monitored during session    Home Living Family/patient expects to be discharged to:: Private residence Living Arrangements: Spouse/significant other Available Help at Discharge: Family Type of Home: House Home Access: [place]: [place]: Environmental consultant - 2 wheels      Prior Function Level of Independence: Independent with assistive device(s)               Hand Dominance        Extremity/Trunk Assessment               Lower Extremity Assessment: LLE deficits/detail   LLE Deficits / Details: maintained KI, unable to perform SLR, ROM TBA     Communication   Communication: No difficulties  Cognition Arousal/Alertness: Awake/alert Behavior During Therapy: WFL for tasks assessed/performed Overall Cognitive Status: Within Functional Limits for tasks assessed                      General Comments      Exercises        Assessment/Plan    PT Assessment Patient needs continued PT services  PT Diagnosis Difficulty walking;Acute  pain   PT Problem List Decreased strength;Decreased mobility;Decreased knowledge of precautions;Decreased range of motion;Pain  PT Treatment Interventions Functional mobility training;Stair training;Gait training;DME instruction;Therapeutic exercise;Patient/family education;Therapeutic activities   PT Goals (Current goals can be found in the Care Plan section) Acute Rehab PT Goals PT Goal Formulation: With patient Time For Goal Achievement: 05/21/16 Potential to Achieve Goals: Good    Frequency 7X/week   Barriers to discharge        Co-evaluation               End of Session Equipment Utilized During Treatment: Gait belt;Left knee  immobilizer Activity Tolerance: Patient tolerated treatment well Patient left: in chair;with call bell/phone within reach           Time: 1524-1536 PT Time Calculation (min) (ACUTE ONLY): 12 min   Charges:   PT Evaluation $PT Eval Low Complexity: 1 Procedure     PT G Codes:        Dontay Harm,KATHrine E 05/17/2016, 5:02 PM Carmelia Bake, PT, DPT 05/17/2016 Pager: [phone]

## 2016-05-17 NOTE — Interval H&P Note (Signed)
History and Physical Interval Note:  05/17/2016 6:47 AM  Sharon Gordon  has presented today for surgery, with the diagnosis of left knee osteoarthritis  The various methods of treatment have been discussed with the patient and family. After consideration of risks, benefits and other options for treatment, the patient has consented to  Procedure(s): LEFT TOTAL KNEE ARTHROPLASTY (Left) as a surgical intervention .  The patient's history has been reviewed, patient examined, no change in status, stable for surgery.  I have reviewed the patient's chart and labs.  Questions were answered to the patient's satisfaction.     Gearlean Alf

## 2016-05-17 NOTE — Op Note (Signed)
OPERATIVE REPORT-TOTAL KNEE ARTHROPLASTY   Pre-operative diagnosis- Osteoarthritis  Left knee(s)  Post-operative diagnosis- Osteoarthritis Left knee(s)  Procedure-  Left  Total Knee Arthroplasty  Surgeon- Sharon Gordon. Sharon Naas, MD  Assistant- Sharon Jourdain, PA-C   Anesthesia-  General  EBL-* No blood loss amount entered *   Drains Hemovac  Tourniquet time-  Total Tourniquet Time Documented: Thigh (Left) - 30 minutes Total: Thigh (Left) - 30 minutes     Complications- None  Condition-PACU - hemodynamically stable.   Brief Clinical Note  Sharon Gordon is a 82 y.o. year old female with end stage OA of her left knee with progressively worsening pain and dysfunction. She has constant pain, with activity and at rest and significant functional deficits with difficulties even with ADLs. She has had extensive non-op management including analgesics, injections of cortisone and viscosupplements, and home exercise program, but remains in significant pain with significant dysfunction. Radiographs show bone on bone arthritis lateral and patellofemoral. She presents now for left Total Knee Arthroplasty.    Procedure in detail---   The patient is brought into the operating room and positioned supine on the operating table. After successful administration of  General,   a tourniquet is placed high on the  Left thigh(s) and the lower extremity is prepped and draped in the usual sterile fashion. Time out is performed by the operating team and then the  Left lower extremity is wrapped in Esmarch, knee flexed and the tourniquet inflated to 300 mmHg.       A midline incision is made with a ten blade through the subcutaneous tissue to the level of the extensor mechanism. A fresh blade is used to make a medial parapatellar arthrotomy. Soft tissue over the proximal medial tibia is subperiosteally elevated to the joint line with a knife and into the semimembranosus bursa with a Cobb elevator. Soft tissue  over the proximal lateral tibia is elevated with attention being paid to avoiding the patellar tendon on the tibial tubercle. The patella is everted, knee flexed 90 degrees and the ACL and PCL are removed. Findings are bone on bone lateral and patellofemoral with large lateral osteophytes.        The drill is used to create a starting hole in the distal femur and the canal is thoroughly irrigated with sterile saline to remove the fatty contents. The 5 degree Left  valgus alignment guide is placed into the femoral canal and the distal femoral cutting block is pinned to remove 10 mm off the distal femur. Resection is made with an oscillating saw.      The tibia is subluxed forward and the menisci are removed. The extramedullary alignment guide is placed referencing proximally at the medial aspect of the tibial tubercle and distally along the second metatarsal axis and tibial crest. The block is pinned to remove 72mm off the more deficient lateral  side. Resection is made with an oscillating saw. Size 2.5is the most appropriate size for the tibia and the proximal tibia is prepared with the modular drill and keel punch for that size.      The femoral sizing guide is placed and size 3 is most appropriate. Rotation is marked off the epicondylar axis and confirmed by creating a rectangular flexion gap at 90 degrees. The size 3 cutting block is pinned in this rotation and the anterior, posterior and chamfer cuts are made with the oscillating saw. The intercondylar block is then placed and that cut is made.  Trial size 2.5 tibial component, trial size 3 posterior stabilized femur and a 10  mm posterior stabilized rotating platform insert trial is placed. Full extension is achieved with excellent varus/valgus and anterior/posterior balance throughout full range of motion. The patella is everted and thickness measured to be 22  mm. Free hand resection is taken to 12 mm, a 38 template is placed, lug holes are drilled,  trial patella is placed, and it tracks normally. Osteophytes are removed off the posterior femur with the trial in place. All trials are removed and the cut bone surfaces prepared with pulsatile lavage. Cement is mixed and once ready for implantation, the size 2.5 tibial implant, size  3 posterior stabilized femoral component, and the size 38 patella are cemented in place and the patella is held with the clamp. The trial insert is placed and the knee held in full extension. The Exparel (20 ml mixed with 30 ml saline) and .25% Bupivicaine, are injected into the extensor mechanism, posterior capsule, medial and lateral gutters and subcutaneous tissues.  All extruded cement is removed and once the cement is hard the permanent 10 mm posterior stabilized rotating platform insert is placed into the tibial tray.      The wound is copiously irrigated with saline solution and the extensor mechanism closed over a hemovac drain with #1 V-loc suture. The tourniquet is released for a total tourniquet time of 29  minutes. Flexion against gravity is 140 degrees and the patella tracks normally. Subcutaneous tissue is closed with 2.0 vicryl and subcuticular with running 4.0 Monocryl. The incision is cleaned and dried and steri-strips and a bulky sterile dressing are applied. The limb is placed into a knee immobilizer and the patient is awakened and transported to recovery in stable condition.      Please note that a surgical assistant was a medical necessity for this procedure in order to perform it in a safe and expeditious manner. Surgical assistant was necessary to retract the ligaments and vital neurovascular structures to prevent injury to them and also necessary for proper positioning of the limb to allow for anatomic placement of the prosthesis.   Sharon Gordon Sharon Bice, MD    05/17/2016, 8:10 AM

## 2016-05-17 NOTE — Anesthesia Preprocedure Evaluation (Signed)
Anesthesia Evaluation  Patient identified by MRN, date of birth, ID band Patient awake    Reviewed: Allergy & Precautions, NPO status , Patient's Chart, lab work & pertinent test results  Airway Mallampati: II  TM Distance: >3 FB Neck ROM: Full    Dental  (+) Teeth Intact, Dental Advisory Given   Pulmonary    breath sounds clear to auscultation       Cardiovascular  Rhythm:Regular Rate:Normal     Neuro/Psych    GI/Hepatic   Endo/Other    Renal/GU      Musculoskeletal   Abdominal   Peds  Hematology   Anesthesia Other Findings   Reproductive/Obstetrics                             Anesthesia Physical Anesthesia Plan  ASA: III  Anesthesia Plan: General   Post-op Pain Management:    Induction:   Airway Management Planned: Oral ETT  Additional Equipment:   Intra-op Plan:   Post-operative Plan:   Informed Consent: I have reviewed the patients History and Physical, chart, labs and discussed the procedure including the risks, benefits and alternatives for the proposed anesthesia with the patient or authorized representative who has indicated his/her understanding and acceptance.     Plan Discussed with: CRNA and Anesthesiologist  Anesthesia Plan Comments:         Anesthesia Quick Evaluation

## 2016-05-18 LAB — CBC
HEMATOCRIT: 27.8 % — AB (ref 36.0–46.0)
Hemoglobin: 9.2 g/dL — ABNORMAL LOW (ref 12.0–15.0)
MCH: 33.3 pg (ref 26.0–34.0)
MCHC: 33.1 g/dL (ref 30.0–36.0)
MCV: 100.7 fL — ABNORMAL HIGH (ref 78.0–100.0)
PLATELETS: 345 10*3/uL (ref 150–400)
RBC: 2.76 MIL/uL — AB (ref 3.87–5.11)
RDW: 14.3 % (ref 11.5–15.5)
WBC: 10.6 10*3/uL — AB (ref 4.0–10.5)

## 2016-05-18 LAB — BASIC METABOLIC PANEL
ANION GAP: 7 (ref 5–15)
BUN: 28 mg/dL — ABNORMAL HIGH (ref 6–20)
CALCIUM: 8.3 mg/dL — AB (ref 8.9–10.3)
CO2: 26 mmol/L (ref 22–32)
Chloride: 103 mmol/L (ref 101–111)
Creatinine, Ser: 1.19 mg/dL — ABNORMAL HIGH (ref 0.44–1.00)
GFR calc non Af Amer: 40 mL/min — ABNORMAL LOW (ref >60)
GFR, EST AFRICAN AMERICAN: 47 mL/min — AB (ref >60)
Glucose, Bld: 117 mg/dL — ABNORMAL HIGH (ref 65–99)
Potassium: 4.7 mmol/L (ref 3.5–5.1)
Sodium: 136 mmol/L (ref 135–145)

## 2016-05-18 MED ORDER — METHOCARBAMOL 500 MG PO TABS: 500.0000 mg | ORAL_TABLET | Freq: Four times a day (QID) | ORAL | 0 refills | Status: DC | PRN

## 2016-05-18 MED ORDER — APIXABAN 2.5 MG PO TABS: 2.5000 mg | ORAL_TABLET | Freq: Two times a day (BID) | ORAL | 0 refills | Status: DC

## 2016-05-18 MED ORDER — OXYCODONE HCL 5 MG PO TABS: 5.0000 mg | ORAL_TABLET | ORAL | 0 refills | Status: DC | PRN

## 2016-05-18 MED ORDER — SODIUM CHLORIDE 0.9 % IV BOLUS (SEPSIS)
250.0000 mL | Freq: Once | INTRAVENOUS | Status: AC
  Administered 2016-05-18: 250 mL via INTRAVENOUS

## 2016-05-18 NOTE — Discharge Summary (Signed)
Physician Discharge Summary   Patient ID: Sharon Gordon MRN: [id number] DOB/AGE: 02-01-1928 82 y.o.  Admit date: 05/17/2016 Discharge date: 05/19/2016  Primary Diagnosis:  Osteoarthritis  Left knee(s)  Admission Diagnoses:  Past Medical History:  Diagnosis Date  . Arthritis   . Cancer ([place])    skin cancer on nose removed   . Hypertension   . Hypothyroidism   . Lateral meniscal tear    right  . Vein symptom    patients reports vein behind left leg at knee hurting at times for last week   Discharge Diagnoses:   Principal Problem:   OA (osteoarthritis) of knee  Estimated body mass index is 22.83 kg/m as calculated from the following:   Height as of this encounter: 5' 4"  (1.626 m).   Weight as of this encounter: 60.3 kg (133 lb).  Procedure:  Procedure(s) (LRB): LEFT TOTAL KNEE ARTHROPLASTY (Left)   Consults: None  HPI: Sharon Gordon is a 82 y.o. year old female with end stage OA of her left knee with progressively worsening pain and dysfunction. She has constant pain, with activity and at rest and significant functional deficits with difficulties even with ADLs. She has had extensive non-op management including analgesics, injections of cortisone and viscosupplements, and home exercise program, but remains in significant pain with significant dysfunction. Radiographs show bone on bone arthritis lateral and patellofemoral. She presents now for left Total Knee Arthroplasty.    Laboratory Data: Admission on 05/17/2016  Component Date Value Ref Range Status  . WBC 05/18/2016 10.6* 4.0 - 10.5 K/uL Final  . RBC 05/18/2016 2.76* 3.87 - 5.11 MIL/uL Final  . Hemoglobin 05/18/2016 9.2* 12.0 - 15.0 g/dL Final  . HCT 05/18/2016 27.8* 36.0 - 46.0 % Final  . MCV 05/18/2016 100.7* 78.0 - 100.0 fL Final  . MCH 05/18/2016 33.3  26.0 - 34.0 pg Final  . MCHC 05/18/2016 33.1  30.0 - 36.0 g/dL Final  . RDW 05/18/2016 14.3  11.5 - 15.5 % Final  . Platelets 05/18/2016 345  150 - 400 K/uL Final   . Sodium 05/18/2016 136  135 - 145 mmol/L Final  . Potassium 05/18/2016 4.7  3.5 - 5.1 mmol/L Final  . Chloride 05/18/2016 103  101 - 111 mmol/L Final  . CO2 05/18/2016 26  22 - 32 mmol/L Final  . Glucose, Bld 05/18/2016 117* 65 - 99 mg/dL Final  . BUN 05/18/2016 28* 6 - 20 mg/dL Final  . Creatinine, Ser 05/18/2016 1.19* 0.44 - 1.00 mg/dL Final  . Calcium 05/18/2016 8.3* 8.9 - 10.3 mg/dL Final  . GFR calc non Af Amer 05/18/2016 40* >60 mL/min Final  . GFR calc Af Amer 05/18/2016 47* >60 mL/min Final   Comment: (NOTE) The eGFR has been calculated using the CKD EPI equation. This calculation has not been validated in all clinical situations. eGFR's persistently <60 mL/min signify possible Chronic Kidney Disease.   [name] gap 05/18/2016 7  5 - 15 Final  Hospital Outpatient Visit on 05/10/2016  Component Date Value Ref Range Status  . aPTT 05/10/2016 29  24 - 36 seconds Final  . WBC 05/10/2016 5.8  4.0 - 10.5 K/uL Final  . RBC 05/10/2016 3.51* 3.87 - 5.11 MIL/uL Final  . Hemoglobin 05/10/2016 11.6* 12.0 - 15.0 g/dL Final  . HCT 05/10/2016 35.4* 36.0 - 46.0 % Final  . MCV 05/10/2016 100.9* 78.0 - 100.0 fL Final  . MCH 05/10/2016 33.0  26.0 - 34.0 pg Final  . MCHC 05/10/2016 32.8  30.0 -  36.0 g/dL Final  . RDW 05/10/2016 14.2  11.5 - 15.5 % Final  . Platelets 05/10/2016 331  150 - 400 K/uL Final  . Sodium 05/10/2016 137  135 - 145 mmol/L Final  . Potassium 05/10/2016 4.3  3.5 - 5.1 mmol/L Final  . Chloride 05/10/2016 101  101 - 111 mmol/L Final  . CO2 05/10/2016 28  22 - 32 mmol/L Final  . Glucose, Bld 05/10/2016 94  65 - 99 mg/dL Final  . BUN 05/10/2016 47* 6 - 20 mg/dL Final  . Creatinine, Ser 05/10/2016 1.48* 0.44 - 1.00 mg/dL Final  . Calcium 05/10/2016 9.2  8.9 - 10.3 mg/dL Final  . Total Protein 05/10/2016 6.7  6.5 - 8.1 g/dL Final  . Albumin 05/10/2016 3.9  3.5 - 5.0 g/dL Final  . AST 05/10/2016 17  15 - 41 U/L Final  . ALT 05/10/2016 16  14 - 54 U/L Final  . Alkaline  Phosphatase 05/10/2016 58  38 - 126 U/L Final  . Total Bilirubin 05/10/2016 0.6  0.3 - 1.2 mg/dL Final  . GFR calc non Af Amer 05/10/2016 31* >60 mL/min Final  . GFR calc Af Amer 05/10/2016 36* >60 mL/min Final   Comment: (NOTE) The eGFR has been calculated using the CKD EPI equation. This calculation has not been validated in all clinical situations. eGFR's persistently <60 mL/min signify possible Chronic Kidney Disease.   . Anion gap 05/10/2016 8  5 - 15 Final  . Prothrombin Time 05/10/2016 12.9  11.4 - 15.2 seconds Final  . INR 05/10/2016 0.97   Final  . ABO/RH(D) 05/17/2016 A POS   Final  . Antibody Screen 05/17/2016 NEG   Final  . Sample Expiration 05/17/2016 05/20/2016   Final  . Extend sample reason 05/17/2016 NO TRANSFUSIONS OR PREGNANCY IN THE PAST 3 MONTHS   Final  . Color, Urine 05/10/2016 YELLOW  YELLOW Final  . APPearance 05/10/2016 CLEAR  CLEAR Final  . Specific Gravity, Urine 05/10/2016 1.008  1.005 - 1.030 Final  . pH 05/10/2016 6.0  5.0 - 8.0 Final  . Glucose, UA 05/10/2016 NEGATIVE  NEGATIVE mg/dL Final  . Hgb urine dipstick 05/10/2016 MODERATE* NEGATIVE Final  . Bilirubin Urine 05/10/2016 NEGATIVE  NEGATIVE Final  . Ketones, ur 05/10/2016 NEGATIVE  NEGATIVE mg/dL Final  . Protein, ur 05/10/2016 NEGATIVE  NEGATIVE mg/dL Final  . Nitrite 05/10/2016 NEGATIVE  NEGATIVE Final  . Leukocytes, UA 05/10/2016 MODERATE* NEGATIVE Final  . MRSA, PCR 05/10/2016 INVALID RESULTS, SPECIMEN SENT FOR CULTURE* NEGATIVE Final  . Staphylococcus aureus 05/10/2016 INVALID RESULTS, SPECIMEN SENT FOR CULTURE* NEGATIVE Final   Comment:        The Xpert SA Assay (FDA approved for NASAL specimens in patients over 24 years of age), is one component of a comprehensive surveillance program.  Test performance has been validated by [facility] for patients greater than or equal to 25 year old. It is not intended to diagnose infection nor to guide or monitor treatment.   . Squamous  Epithelial / LPF 05/10/2016 0-5* NONE SEEN Final  . WBC, UA 05/10/2016 6-30  0 - 5 WBC/hpf Final  . RBC / HPF 05/10/2016 6-30  0 - 5 RBC/hpf Final  . Bacteria, UA 05/10/2016 FEW* NONE SEEN Final  . Specimen Description 05/12/2016 NOSE   Final  . Special Requests 05/12/2016 NONE   Final  . Culture 05/12/2016    Final  Value:NO STAPHYLOCOCCUS AUREUS ISOLATED Performed at [facility]   . Report Status 05/12/2016 05/12/2016 FINAL   Final     X-Rays:No results found.  EKG: Orders placed or performed during the hospital encounter of 05/10/16  . EKG 12 lead  . EKG 12 lead     Hospital Course: Kamiya Acord is a 82 y.o. who was admitted to [facility]. They were brought to the operating room on 05/17/2016 and underwent Procedure(s): LEFT TOTAL KNEE ARTHROPLASTY.  Patient tolerated the procedure well and was later transferred to the recovery room and then to the orthopaedic floor for postoperative care.  They were given PO and IV analgesics for pain control following their surgery.  They were given 24 hours of postoperative antibiotics of  Anti-infectives    Start     Dose/Rate Route Frequency Ordered Stop   05/17/16 1400  ceFAZolin (ANCEF) IVPB 2g/100 mL premix     2 g 200 mL/hr over 30 Minutes Intravenous Every 6 hours 05/17/16 0957 05/17/16 2012   05/17/16 0506  ceFAZolin (ANCEF) IVPB 2g/100 mL premix     2 g 200 mL/hr over 30 Minutes Intravenous On call to O.R. 05/17/16 7253 05/17/16 0720     and started on DVT prophylaxis in the form of Eliquis.   PT and OT were ordered for total joint protocol.  Discharge planning consulted to help with postop disposition and equipment needs.  Patient had a tough night on the evening of surgery with low pressues.  Given additional fluids  They started to get up OOB with therapy on day one. Hemovac drain was pulled without difficulty.  Continued to work with therapy into day two.  Dressing was changed on day two and the  incision was healing well  Doing better on day two. Patient was seen in rounds on POD 2 and was ready to go home.  Discharge home with home health Diet - Cardiac diet Follow up - in 2 weeks Activity - WBAT Disposition - Home Condition Upon Discharge - Good D/C Meds - See DC Summary DVT Prophylaxis - Eliquis  Discharge Instructions    Call MD / Call 911    Complete by:  As directed   If you experience chest pain or shortness of breath, CALL 911 and be transported to the hospital emergency room.  If you develope a fever above 101 F, pus (white drainage) or increased drainage or redness at the wound, or calf pain, call your surgeon's office.   Change dressing    Complete by:  As directed   Change dressing daily with sterile 4 x 4 inch gauze dressing and apply TED hose. Do not submerge the incision under water.   Constipation Prevention    Complete by:  As directed   Drink plenty of fluids.  Prune juice may be helpful.  You may use a stool softener, such as Colace (over the counter) 100 mg twice a day.  Use MiraLax (over the counter) for constipation as needed.   Diet - low sodium heart healthy    Complete by:  As directed   Discharge instructions    Complete by:  As directed   Pick up stool softner and laxative for home use following surgery while on pain medications. Do not submerge incision under water. Please use good hand washing techniques while changing dressing each day. May shower starting three days after surgery. Please use a clean towel to pat the incision dry following showers. Continue to use ice  for pain and swelling after surgery. Do not use any lotions or creams on the incision until instructed by your surgeon.   Postoperative Constipation Protocol  Constipation - defined medically as fewer than three stools per week and severe constipation as less than one stool per week.  One of the most common issues patients have following surgery is constipation.  Even if you have a  regular bowel pattern at home, your normal regimen is likely to be disrupted due to multiple reasons following surgery.  Combination of anesthesia, postoperative narcotics, change in appetite and fluid intake all can affect your bowels.  In order to avoid complications following surgery, here are some recommendations in order to help you during your recovery period.  Colace (docusate) - Pick up an over-the-counter form of Colace or another stool softener and take twice a day as long as you are requiring postoperative pain medications.  Take with a full glass of water daily.  If you experience loose stools or diarrhea, hold the colace until you stool forms back up.  If your symptoms do not get better within 1 week or if they get worse, check with your doctor.  Dulcolax (bisacodyl) - Pick up over-the-counter and take as directed by the product packaging as needed to assist with the movement of your bowels.  Take with a full glass of water.  Use this product as needed if not relieved by Colace only.   MiraLax (polyethylene glycol) - Pick up over-the-counter to have on hand.  MiraLax is a solution that will increase the amount of water in your bowels to assist with bowel movements.  Take as directed and can mix with a glass of water, juice, soda, coffee, or tea.  Take if you go more than two days without a movement. Do not use MiraLax more than once per day. Call your doctor if you are still constipated or irregular after using this medication for 7 days in a row.  If you continue to have problems with postoperative constipation, please contact the office for further assistance and recommendations.  If you experience "the worst abdominal pain ever" or develop nausea or vomiting, please contact the office immediatly for further recommendations for treatment.   Take Eliquis twice a day for two and a half more weeks, then discontinue Eliquis. Once the patient has completed the blood thinner regimen, then take  a Baby 81 mg Aspirin daily for three more weeks.   Do not put a pillow under the knee. Place it under the heel.    Complete by:  As directed   Do not sit on low chairs, stoools or toilet seats, as it may be difficult to get up from low surfaces    Complete by:  As directed   Driving restrictions    Complete by:  As directed   No driving until released by the physician.   Increase activity slowly as tolerated    Complete by:  As directed   Lifting restrictions    Complete by:  As directed   No lifting until released by the physician.   Patient may shower    Complete by:  As directed   You may shower without a dressing once there is no drainage.  Do not wash over the wound.  If drainage remains, do not shower until drainage stops.   TED hose    Complete by:  As directed   Use stockings (TED hose) for 3 weeks on both leg(s).  You may remove  them at night for sleeping.   Weight bearing as tolerated    Complete by:  As directed   Laterality:  left   Extremity:  Lower       Medication List    STOP taking these medications   cholecalciferol 1000 units tablet Commonly known as:  VITAMIN D   diclofenac 75 MG EC tablet Commonly known as:  VOLTAREN   Fish Oil 1000 MG Caps   multivitamin with minerals Tabs tablet     TAKE these medications   acetaminophen 325 MG tablet Commonly known as:  TYLENOL Take 325 mg by mouth every 6 (six) hours as needed for moderate pain.   amLODipine 5 MG tablet Commonly known as:  NORVASC Take 5 mg by mouth every evening.   apixaban 2.5 MG Tabs tablet Commonly known as:  ELIQUIS Take 1 tablet (2.5 mg total) by mouth every 12 (twelve) hours. Take Eliquis twice a day for two and a half more weeks, then discontinue the Eliquis. Once the patient has completed the blood thinner regimen, then take a Baby 81 mg Aspirin daily for three more weeks.   DULoxetine 30 MG capsule Commonly known as:  CYMBALTA Take 30 mg by mouth every morning.   gabapentin 300 MG  capsule Commonly known as:  NEURONTIN Take 600 mg by mouth 3 (three) times daily.   levothyroxine 50 MCG tablet Commonly known as:  SYNTHROID, LEVOTHROID Take 50 mcg by mouth daily before breakfast.   lisinopril-hydrochlorothiazide 20-12.5 MG tablet Commonly known as:  PRINZIDE,ZESTORETIC Take 1 tablet by mouth daily.   methocarbamol 500 MG tablet Commonly known as:  ROBAXIN Take 1 tablet (500 mg total) by mouth every 6 (six) hours as needed for muscle spasms.   oxyCODONE 5 MG immediate release tablet Commonly known as:  Oxy IR/ROXICODONE Take 1-2 tablets (5-10 mg total) by mouth every 3 (three) hours as needed for moderate pain or severe pain.      Follow-up Information    [facility] .   Why:  now known as [place]; home health physical therapy Contact information: [address] [place] [address] [phone]        Gearlean Alf, MD. Schedule an appointment as soon as possible for a visit on 06/01/2016.   Specialty:  Orthopedic Surgery Why:  Call office at [phone] to setup appointment on Tuesday 06/01/2016 with Dr. Denman George information: [address] [place] [address] [phone]           Signed: Arlee Muslim, PA-C Orthopaedic Surgery 05/18/2016, 10:33 PM

## 2016-05-18 NOTE — Discharge Instructions (Addendum)

## 2016-05-18 NOTE — Evaluation (Signed)
Occupational Therapy Evaluation Patient Details Name: Sharon Gordon MRN: [id number] DOB: 1927-12-13 Today's Date: 05/18/2016    History of Present Illness Pt is an 82 year old female s/p L TKA with hx of R TKA 07/14/15 and back surgery   Clinical Impression   This 82 year old female was admitted for the above sx. All education was completed. No further OT is needed at this time    Follow Up Recommendations  No OT follow up;Supervision/Assistance - 24 hour    Equipment Recommendations  None recommended by OT    Recommendations for Other Services       Precautions / Restrictions Precautions Precautions: Fall;Knee Required Braces or Orthoses: Knee Immobilizer - Left Restrictions Other Position/Activity Restrictions: WBAT      Mobility Bed Mobility         Supine to sit: Min assist     General bed mobility comments: for LLE  pt used arms to self assist also  Transfers   Equipment used: Rolling walker (2 wheeled) Transfers: Sit to/from Stand Sit to Stand: Min assist;Min guard         General transfer comment: min A from bed and min guard from 3:1    Balance                                            ADL Overall ADL's : Needs assistance/impaired     Grooming: Oral care;Supervision/safety;Standing   Upper Body Bathing: Supervision/ safety;Standing   Lower Body Bathing: Minimal assistance;Sit to/from stand   Upper Body Dressing : Minimal assistance;Sitting (lines)   Lower Body Dressing: Minimal assistance;Sit to/from stand (underwear)   Toilet Transfer: Minimal assistance;Transfer board;BSC;RW   Toileting- Clothing Manipulation and Hygiene: Min guard;Sit to/from stand         General ADL Comments: ambulated to bathroom and performed adl and toileting.  Pt verbalizes sequence for shower transfer and did not feel she needed to practice this. Cues given for safety when in bathroom as pt attempted to turn without RW     Vision      Perception     Praxis      Pertinent Vitals/Pain Pain Score: 5  Pain Location: L knee Pain Descriptors / Indicators: Aching Pain Intervention(s): Limited activity within patient's tolerance;Monitored during session;Premedicated before session;Repositioned;Ice applied     Hand Dominance     Extremity/Trunk Assessment Upper Extremity Assessment Upper Extremity Assessment: Overall WFL for tasks assessed           Communication Communication Communication: No difficulties   Cognition Arousal/Alertness: Awake/alert Behavior During Therapy: WFL for tasks assessed/performed Overall Cognitive Status: Within Functional Limits for tasks assessed                     General Comments       Exercises       Shoulder Instructions      Home Living Family/patient expects to be discharged to:: Private residence Living Arrangements: Spouse/significant other Available Help at Discharge: Family               Bathroom Shower/Tub: Walk-in Corporate treasurer Toilet: Handicapped height     Home Equipment: Bedside commode          Prior Functioning/Environment Level of Independence: Independent             OT Diagnosis: Acute pain  OT Problem List:     OT Treatment/Interventions:      OT Goals(Current goals can be found in the care plan section) Acute Rehab OT Goals Patient Stated Goal: return to being independent and home tomorrow OT Goal Formulation: All assessment and education complete, DC therapy  OT Frequency:     Barriers to D/C:            Co-evaluation              End of Session Equipment Utilized During Treatment: Rolling walker  Activity Tolerance: Patient tolerated treatment well Patient left: in chair;with call bell/phone within reach   Time: 0732-0758 OT Time Calculation (min): 26 min Charges:  OT General Charges $OT Visit: 1 Procedure OT Evaluation $OT Eval Low Complexity: 1 Procedure OT Treatments $Self  Care/Home Management : 8-22 mins G-Codes:    Allyssa Abruzzese 06/04/16, 8:53 AM Lesle Chris, OTR/L [phone] 06-04-16

## 2016-05-18 NOTE — Progress Notes (Signed)
Physical Therapy Treatment Patient Details Name: Sharon Gordon MRN: [id number] DOB: 1928-04-09 Today's Date: 05/18/2016    History of Present Illness Pt is an 82 year old female s/p L TKA with hx of R TKA 07/14/15 and back surgery    PT Comments    POD # 1 am session Assisted OOB to amb in hallway then perform TKR TE's followed by ICE  Follow Up Recommendations        Equipment Recommendations       Recommendations for Other Services       Precautions / Restrictions Precautions Precautions: Fall;Knee Restrictions Weight Bearing Restrictions: No Other Position/Activity Restrictions: WBAT    Mobility  Bed Mobility Overal bed mobility: Needs Assistance Bed Mobility: Supine to Sit     Supine to sit: Min guard;Min assist     General bed mobility comments: increased time  Transfers Overall transfer level: Needs assistance Equipment used: Rolling walker (2 wheeled) Transfers: Sit to/from Stand Sit to Stand: Min assist;Min guard         General transfer comment: min A from bed and min guard from 3:1  Ambulation/Gait Ambulation/Gait assistance: Min guard;Min assist Ambulation Distance (Feet): 84 Feet Assistive device: Rolling walker (2 wheeled) Gait Pattern/deviations: Step-to pattern;Step-through pattern Gait velocity: WFL   General Gait Details: verbal cues for sequence, RW positioning, posture, step length, slow but steady gait   Stairs            Wheelchair Mobility    Modified Rankin (Stroke Patients Only)       Balance                                    Cognition Arousal/Alertness: Awake/alert Behavior During Therapy: WFL for tasks assessed/performed Overall Cognitive Status: Within Functional Limits for tasks assessed                      Exercises   Total Knee Replacement TE's 10 reps B LE ankle pumps 10 reps towel squeezes 10 reps knee presses 10 reps heel slides  10 reps SAQ's 10 reps SLR's 10 reps  ABD Followed by ICE     General Comments        Pertinent Vitals/Pain Pain Assessment: 0-10 Pain Score: 3  Pain Location: L knee Pain Descriptors / Indicators: Tender Pain Intervention(s): Monitored during session;Repositioned;Ice applied    Home Living                      Prior Function            PT Goals (current goals can now be found in the care plan section)      Frequency       PT Plan      Co-evaluation             End of Session           Time: [id number] PT Time Calculation (min) (ACUTE ONLY): 26 min  Charges:  $Gait Training: 8-22 mins $Therapeutic Exercise: 8-22 mins                    G Codes:      Rica Koyanagi  PTA [facility]  Acute  Rehab Pager      [phone]

## 2016-05-18 NOTE — Care Management Note (Signed)
Case Management Note  Patient Details  Name: Sharon Gordon MRN: [id number] Date of Birth: April 01, 1928  Subjective/Objective:                  LEFT TOTAL KNEE ARTHROPLASTY (Left) Action/Plan: Discharge planning Expected Discharge Date: 05/19/16                 Expected Discharge Plan:  [place]  In-House Referral:     Discharge planning Services  CM Consult  Post Acute Care Choice:  Home Health Choice offered to:  Patient, Spouse  DME Arranged:  N/A DME Agency:  NA  HH Arranged:  [place] Agency:  [facility]  Status of Service:  Completed, signed off  If discussed at [employer], dates discussed:    Additional Comments: CM met with pt in room to offer choice of home health agency.  Pt chooses [facility] to render HHPT.  Referral called to [facility] rep, Tim.  Pt states she has all DME from previous surgery.  No other CM needs were communicated. Dellie Catholic, RN 05/18/2016, 12:53 PM

## 2016-05-18 NOTE — Progress Notes (Signed)
   Subjective: 1 Day Post-Op Procedure(s) (LRB): LEFT TOTAL KNEE ARTHROPLASTY (Left) Patient reports pain as mild.   Patient seen in rounds with Dr. Wynelle Link.  Had some low pressure last night.  Will give additional fluids this morning. HGB was 9.2 this morning. Started at 11.6 preop. Patient is well, but has had some minor complaints of pain in the knee, requiring pain medications We will resume therapy today. Got up and walked 60 feet yesterday. Plan is to go Home after hospital stay.  Objective: Vital signs in last 24 hours: Temp:  [97.4 F (36.3 C)-98.3 F (36.8 C)] 97.9 F (36.6 C) (08/15 0653) Pulse Rate:  [70-99] 74 (08/15 0653) Resp:  [9-18] 16 (08/15 0653) BP: (100-177)/(56-129) 124/56 (08/15 0653) SpO2:  [94 %-100 %] 99 % (08/15 0653)  Intake/Output from previous day:  Intake/Output Summary (Last 24 hours) at 05/18/16 0759 Last data filed at 05/18/16 [id number]  Gross per 24 hour  Intake           3871.5 ml  Output             2740 ml  Net           1131.5 ml    Intake/Output this shift: No intake/output data recorded.  Labs:  Recent Labs  05/18/16 0414  HGB 9.2*    Recent Labs  05/18/16 0414  WBC 10.6*  RBC 2.76*  HCT 27.8*  PLT 345    Recent Labs  05/18/16 0414  NA 136  K 4.7  CL 103  CO2 26  BUN 28*  CREATININE 1.19*  GLUCOSE 117*  CALCIUM 8.3*   No results for input(s): LABPT, INR in the last 72 hours.  EXAM General - Patient is Alert, Appropriate and Oriented Extremity - Neurovascular intact Sensation intact distally Dorsiflexion/Plantar flexion intact Dressing - dressing C/D/I Motor Function - intact, moving foot and toes well on exam.  Hemovac pulled without difficulty.  Past Medical History:  Diagnosis Date  . Arthritis   . Cancer ([place])    skin cancer on nose removed   . Hypertension   . Hypothyroidism   . Lateral meniscal tear    right  . Vein symptom    patients reports vein behind left leg at knee hurting at times for  last week    Assessment/Plan: 1 Day Post-Op Procedure(s) (LRB): LEFT TOTAL KNEE ARTHROPLASTY (Left) Principal Problem:   OA (osteoarthritis) of knee  Estimated body mass index is 22.83 kg/m as calculated from the following:   Height as of this encounter: 5\' 4"  (1.626 m).   Weight as of this encounter: 60.3 kg (133 lb). Advance diet Up with therapy Plan for discharge tomorrow Discharge home with home health  Fluids today. Recheck BP after bolus.  DVT Prophylaxis - Eliquis Weight-Bearing as tolerated to left leg D/C O2 and Pulse OX and try on Room Air  Arlee Muslim, PA-C Orthopaedic Surgery 05/18/2016, 7:59 AM

## 2016-05-18 NOTE — Progress Notes (Signed)
Physical Therapy Treatment Patient Details Name: Taaliyah Pasciak MRN: [id number] DOB: 05-Feb-1928 Today's Date: 05/18/2016    History of Present Illness Pt is an 82 year old female s/p L TKA with hx of R TKA 07/14/15 and back surgery    PT Comments    POD # 1 pm session Assisted OOB to amb a greater distance then back to bed to perform TKR TE's followed bu ICE.  Pt progressing well.  Follow Up Recommendations        Equipment Recommendations       Recommendations for Other Services       Precautions / Restrictions Precautions Precautions: Fall;Knee Restrictions Weight Bearing Restrictions: No Other Position/Activity Restrictions: WBAT    Mobility  Bed Mobility Overal bed mobility: Needs Assistance Bed Mobility: Supine to Sit     Supine to sit: Min guard;Min assist     General bed mobility comments: assisted OOB and back into bed for CPM  Transfers Overall transfer level: Needs assistance Equipment used: Rolling walker (2 wheeled) Transfers: Sit to/from Stand Sit to Stand: Min assist;Min guard         General transfer comment: min A from bed and min guard from 3:1  Ambulation/Gait Ambulation/Gait assistance: Min guard;Min assist Ambulation Distance (Feet): 86 Feet Assistive device: Rolling walker (2 wheeled) Gait Pattern/deviations: Step-to pattern;Step-through pattern Gait velocity: WFL   General Gait Details: verbal cues for sequence, RW positioning, posture, step length, slow but steady gait   Stairs            Wheelchair Mobility    Modified Rankin (Stroke Patients Only)       Balance                                    Cognition Arousal/Alertness: Awake/alert Behavior During Therapy: WFL for tasks assessed/performed Overall Cognitive Status: Within Functional Limits for tasks assessed                      Exercises   Total Knee Replacement TE's 10 reps B LE ankle pumps 10 reps towel squeezes 10 reps knee  presses 10 reps heel slides  10 reps SLR's  Followed by ICE     General Comments        Pertinent Vitals/Pain Pain Assessment: 0-10 Pain Score: 3  Pain Location: L knee Pain Descriptors / Indicators: Tender Pain Intervention(s): Monitored during session;Repositioned;Ice applied    Home Living                      Prior Function            PT Goals (current goals can now be found in the care plan section)      Frequency       PT Plan      Co-evaluation             End of Session           Time: 1422-1446 PT Time Calculation (min) (ACUTE ONLY): 24 min  Charges:  $Gait Training: 8-22 mins $Therapeutic Exercise: 8-22 mins                    G Codes:      Rica Koyanagi  PTA [facility]  Acute  Rehab Pager      [phone]

## 2016-05-19 LAB — BASIC METABOLIC PANEL
Anion gap: 7 (ref 5–15)
BUN: 28 mg/dL — AB (ref 6–20)
CALCIUM: 8.6 mg/dL — AB (ref 8.9–10.3)
CHLORIDE: 107 mmol/L (ref 101–111)
CO2: 26 mmol/L (ref 22–32)
CREATININE: 1 mg/dL (ref 0.44–1.00)
GFR calc non Af Amer: 50 mL/min — ABNORMAL LOW (ref >60)
GFR, EST AFRICAN AMERICAN: 57 mL/min — AB (ref >60)
Glucose, Bld: 100 mg/dL — ABNORMAL HIGH (ref 65–99)
Potassium: 4.1 mmol/L (ref 3.5–5.1)
Sodium: 140 mmol/L (ref 135–145)

## 2016-05-19 LAB — CBC
HEMATOCRIT: 25.2 % — AB (ref 36.0–46.0)
HEMOGLOBIN: 8.2 g/dL — AB (ref 12.0–15.0)
MCH: 32.5 pg (ref 26.0–34.0)
MCHC: 32.5 g/dL (ref 30.0–36.0)
MCV: 100 fL (ref 78.0–100.0)
Platelets: 328 10*3/uL (ref 150–400)
RBC: 2.52 MIL/uL — ABNORMAL LOW (ref 3.87–5.11)
RDW: 14.6 % (ref 11.5–15.5)
WBC: 10.8 10*3/uL — ABNORMAL HIGH (ref 4.0–10.5)

## 2016-05-19 MED ORDER — POLYSACCHARIDE IRON COMPLEX 150 MG PO CAPS: 150.0000 mg | ORAL_CAPSULE | Freq: Two times a day (BID) | ORAL | 0 refills | Status: DC

## 2016-05-19 MED ORDER — POLYSACCHARIDE IRON COMPLEX 150 MG PO CAPS
150.0000 mg | ORAL_CAPSULE | Freq: Two times a day (BID) | ORAL | Status: DC
  Administered 2016-05-19: 150 mg via ORAL
  Filled 2016-05-19: qty 1

## 2016-05-19 NOTE — Progress Notes (Signed)
   Subjective: 2 Days Post-Op Procedure(s) (LRB): LEFT TOTAL KNEE ARTHROPLASTY (Left) Patient reports pain as mild.   Patient seen in rounds with Dr. Wynelle Link. Patient is well, but has had some minor complaints of pain in the knee, requiring pain medications Patient is ready to go home  Objective: Vital signs in last 24 hours: Temp:  [97.7 F (36.5 C)-99.6 F (37.6 C)] 99 F (37.2 C) (08/16 0435) Pulse Rate:  [74-112] 89 (08/16 0435) Resp:  [14-17] 14 (08/16 0435) BP: (105-137)/(61-83) 108/66 (08/16 0435) SpO2:  [88 %-100 %] 92 % (08/16 0435)  Intake/Output from previous day:  Intake/Output Summary (Last 24 hours) at 05/19/16 0732 Last data filed at 05/19/16 0618  Gross per 24 hour  Intake             1240 ml  Output             1975 ml  Net             -735 ml    Intake/Output this shift: No intake/output data recorded.  Labs:  Recent Labs  05/18/16 0414 05/19/16 0427  HGB 9.2* 8.2*    Recent Labs  05/18/16 0414 05/19/16 0427  WBC 10.6* 10.8*  RBC 2.76* 2.52*  HCT 27.8* 25.2*  PLT 345 328    Recent Labs  05/18/16 0414 05/19/16 0427  NA 136 140  K 4.7 4.1  CL 103 107  CO2 26 26  BUN 28* 28*  CREATININE 1.19* 1.00  GLUCOSE 117* 100*  CALCIUM 8.3* 8.6*   No results for input(s): LABPT, INR in the last 72 hours.  EXAM: General - Patient is Alert, Appropriate and Oriented Extremity - Neurovascular intact Sensation intact distally Dorsiflexion/Plantar flexion intact Incision - clean, dry, no drainage Motor Function - intact, moving foot and toes well on exam.   Assessment/Plan: 2 Days Post-Op Procedure(s) (LRB): LEFT TOTAL KNEE ARTHROPLASTY (Left) Procedure(s) (LRB): LEFT TOTAL KNEE ARTHROPLASTY (Left) Past Medical History:  Diagnosis Date  . Arthritis   . Cancer ([place])    skin cancer on nose removed   . Hypertension   . Hypothyroidism   . Lateral meniscal tear    right  . Vein symptom    patients reports vein behind left leg at knee  hurting at times for last week   Principal Problem:   OA (osteoarthritis) of knee  Estimated body mass index is 22.83 kg/m as calculated from the following:   Height as of this encounter: 5\' 4"  (1.626 m).   Weight as of this encounter: 60.3 kg (133 lb). Up with therapy Discharge home with home health Diet - Cardiac diet Follow up - in 2 weeks Activity - WBAT Disposition - Home Condition Upon Discharge - Good D/C Meds - See DC Summary DVT Prophylaxis - Eliquis  Arlee Muslim, PA-C Orthopaedic Surgery 05/19/2016, 7:32 AM

## 2016-05-19 NOTE — Progress Notes (Signed)
Physical Therapy Treatment Patient Details Name: Genelle Toller MRN: [id number] DOB: Apr 17, 1928 Today's Date: 05/19/2016    History of Present Illness Pt is an 82 year old female s/p L TKA with hx of R TKA 07/14/15 and back surgery    PT Comments    POD # 2 am session.  Assisted with amb a greater distance in hallway, assisted to bathroom then back to bed to perform TKE TE's following HEP.    Follow Up Recommendations  Home health PT;Supervision for mobility/OOB     Equipment Recommendations  None recommended by PT    Recommendations for Other Services       Precautions / Restrictions Precautions Precautions: Fall;Knee Restrictions Weight Bearing Restrictions: No Other Position/Activity Restrictions: WBAT    Mobility  Bed Mobility Overal bed mobility: Needs Assistance             General bed mobility comments: assisted OOB and back into bed for TE's  Transfers Overall transfer level: Needs assistance Equipment used: Rolling walker (2 wheeled) Transfers: Sit to/from Stand Sit to Stand: Min guard         General transfer comment: one VC for safety with turns  Ambulation/Gait Ambulation/Gait assistance: Supervision;Min guard Ambulation Distance (Feet): 82 Feet Assistive device: Rolling walker (2 wheeled) Gait Pattern/deviations: Step-to pattern;Step-through pattern Gait velocity: WFL   General Gait Details: verbal cues for sequence, RW positioning, posture, step length, slow but steady gait   Stairs            Wheelchair Mobility    Modified Rankin (Stroke Patients Only)       Balance                                    Cognition Arousal/Alertness: Awake/alert Behavior During Therapy: WFL for tasks assessed/performed Overall Cognitive Status: Within Functional Limits for tasks assessed                      Exercises   Total Knee Replacement TE's 10 reps B LE ankle pumps 10 reps towel squeezes 10 reps knee  presses 10 reps heel slides  10 reps SAQ's 10 reps SLR's 10 reps ABD Followed by ICE     General Comments        Pertinent Vitals/Pain Pain Assessment: 0-10 Pain Score: 3  Pain Location: L knee Pain Descriptors / Indicators: Tender Pain Intervention(s): Monitored during session;Repositioned;Ice applied    Home Living                      Prior Function            PT Goals (current goals can now be found in the care plan section) Progress towards PT goals: Progressing toward goals    Frequency  7X/week    PT Plan Current plan remains appropriate    Co-evaluation             End of Session Equipment Utilized During Treatment: Gait belt Activity Tolerance: Patient tolerated treatment well Patient left: in bed;with call bell/phone within reach     Time: 1000-1025 PT Time Calculation (min) (ACUTE ONLY): 25 min  Charges:  $Gait Training: 8-22 mins $Therapeutic Exercise: 8-22 mins                    G Codes:      Rica Koyanagi  PTA [facility]  Acute  Rehab  Pager      [phone]

## 2016-05-19 NOTE — Progress Notes (Signed)
Physical Therapy Treatment Patient Details Name: Sesen Laventure MRN: [id number] DOB: 10-19-1927 Today's Date: 05/19/2016    History of Present Illness Pt is an 82 year old female s/p L TKA with hx of R TKA 07/14/15 and back surgery    PT Comments    POD # 2 Returned to complete instruction on TE's following HEP.  Instructed family member on freq and proper tech as well as use of ICE.   Pt ready for D/C to home.   Follow Up Recommendations  Home health PT;Supervision for mobility/OOB     Equipment Recommendations  None recommended by PT    Recommendations for Other Services       Precautions / Restrictions Precautions Precautions: Fall;Knee Restrictions Weight Bearing Restrictions: No Other Position/Activity Restrictions: WBAT       Balance                                    Cognition Arousal/Alertness: Awake/alert Behavior During Therapy: WFL for tasks assessed/performed Overall Cognitive Status: Within Functional Limits for tasks assessed                      Exercises   Total Knee Replacement TE's  10 reps SAQ's 10 reps SLR's 10 reps ABD Followed by ICE     General Comments        Pertinent Vitals/Pain Pain Assessment: 0-10 Pain Score: 3  Pain Location: L knee Pain Descriptors / Indicators: Tender Pain Intervention(s): Monitored during session;Repositioned;Ice applied    Home Living                      Prior Function            PT Goals (current goals can now be found in the care plan section) Progress towards PT goals: Progressing toward goals    Frequency  7X/week    PT Plan Current plan remains appropriate    Co-evaluation             End of Session Equipment Utilized During Treatment: Gait belt Activity Tolerance: Patient tolerated treatment well Patient left: in bed;with call bell/phone within reach     Time: 1054-1104 PT Time Calculation (min) (ACUTE ONLY): 10 min  Charges:    $Therapeutic Exercise: 8-22 mins                    G Codes:      Rica Koyanagi  PTA [facility]  Acute  Rehab Pager      [phone]

## 2016-11-18 ENCOUNTER — Other Ambulatory Visit: Payer: Self-pay | Admitting: Orthopedic Surgery

## 2016-11-18 ENCOUNTER — Ambulatory Visit (INDEPENDENT_AMBULATORY_CARE_PROVIDER_SITE_OTHER): Payer: Medicare Other

## 2016-11-18 ENCOUNTER — Other Ambulatory Visit (INDEPENDENT_AMBULATORY_CARE_PROVIDER_SITE_OTHER): Payer: Medicare Other

## 2016-11-18 DIAGNOSIS — M25562 Pain in left knee: Secondary | ICD-10-CM

## 2016-11-18 DIAGNOSIS — R52 Pain, unspecified: Secondary | ICD-10-CM

## 2017-02-08 ENCOUNTER — Other Ambulatory Visit: Payer: Self-pay | Admitting: Physical Medicine and Rehabilitation

## 2017-02-08 DIAGNOSIS — M5136 Other intervertebral disc degeneration, lumbar region: Secondary | ICD-10-CM

## 2017-02-09 ENCOUNTER — Ambulatory Visit
Admission: RE | Admit: 2017-02-09 | Discharge: 2017-02-09 | Disposition: A | Discharge status: Home / Self Care | Payer: Medicare Other | Source: Ambulatory Visit | Attending: Physical Medicine and Rehabilitation | Admitting: Physical Medicine and Rehabilitation

## 2017-02-09 DIAGNOSIS — M5136 Other intervertebral disc degeneration, lumbar region: Secondary | ICD-10-CM

## 2017-02-18 ENCOUNTER — Ambulatory Visit: Payer: Self-pay | Admitting: Physician Assistant

## 2017-03-15 ENCOUNTER — Encounter (HOSPITAL_COMMUNITY): Payer: Self-pay

## 2017-03-15 ENCOUNTER — Encounter (HOSPITAL_COMMUNITY)
Admission: RE | Admit: 2017-03-15 | Discharge: 2017-03-15 | Disposition: A | Discharge status: Home / Self Care | Payer: Medicare Other | Source: Ambulatory Visit | Attending: Orthopedic Surgery | Admitting: Orthopedic Surgery

## 2017-03-15 DIAGNOSIS — Z96653 Presence of artificial knee joint, bilateral: Secondary | ICD-10-CM | POA: Diagnosis not present

## 2017-03-15 DIAGNOSIS — Z823 Family history of stroke: Secondary | ICD-10-CM | POA: Diagnosis not present

## 2017-03-15 DIAGNOSIS — Z87891 Personal history of nicotine dependence: Secondary | ICD-10-CM | POA: Diagnosis not present

## 2017-03-15 DIAGNOSIS — M48061 Spinal stenosis, lumbar region without neurogenic claudication: Secondary | ICD-10-CM | POA: Diagnosis not present

## 2017-03-15 DIAGNOSIS — M1990 Unspecified osteoarthritis, unspecified site: Secondary | ICD-10-CM | POA: Diagnosis not present

## 2017-03-15 DIAGNOSIS — M25551 Pain in right hip: Secondary | ICD-10-CM | POA: Diagnosis not present

## 2017-03-15 DIAGNOSIS — Z981 Arthrodesis status: Secondary | ICD-10-CM | POA: Diagnosis not present

## 2017-03-15 DIAGNOSIS — E039 Hypothyroidism, unspecified: Secondary | ICD-10-CM | POA: Diagnosis not present

## 2017-03-15 DIAGNOSIS — Z85828 Personal history of other malignant neoplasm of skin: Secondary | ICD-10-CM | POA: Diagnosis not present

## 2017-03-15 DIAGNOSIS — Z79899 Other long term (current) drug therapy: Secondary | ICD-10-CM | POA: Diagnosis not present

## 2017-03-15 DIAGNOSIS — Z96651 Presence of right artificial knee joint: Secondary | ICD-10-CM | POA: Diagnosis not present

## 2017-03-15 DIAGNOSIS — G894 Chronic pain syndrome: Secondary | ICD-10-CM | POA: Diagnosis present

## 2017-03-15 DIAGNOSIS — M7061 Trochanteric bursitis, right hip: Secondary | ICD-10-CM | POA: Diagnosis not present

## 2017-03-15 DIAGNOSIS — I447 Left bundle-branch block, unspecified: Secondary | ICD-10-CM | POA: Diagnosis not present

## 2017-03-15 DIAGNOSIS — Z7982 Long term (current) use of aspirin: Secondary | ICD-10-CM | POA: Diagnosis not present

## 2017-03-15 DIAGNOSIS — I10 Essential (primary) hypertension: Secondary | ICD-10-CM | POA: Diagnosis not present

## 2017-03-15 DIAGNOSIS — F419 Anxiety disorder, unspecified: Secondary | ICD-10-CM | POA: Diagnosis not present

## 2017-03-15 DIAGNOSIS — Z8249 Family history of ischemic heart disease and other diseases of the circulatory system: Secondary | ICD-10-CM | POA: Diagnosis not present

## 2017-03-15 DIAGNOSIS — M5116 Intervertebral disc disorders with radiculopathy, lumbar region: Secondary | ICD-10-CM | POA: Diagnosis not present

## 2017-03-15 HISTORY — DX: Anxiety disorder, unspecified: F41.9

## 2017-03-15 LAB — CBC
HCT: 39 % (ref 36.0–46.0)
Hemoglobin: 12.6 g/dL (ref 12.0–15.0)
MCH: 32 pg (ref 26.0–34.0)
MCHC: 32.3 g/dL (ref 30.0–36.0)
MCV: 99 fL (ref 78.0–100.0)
PLATELETS: 331 10*3/uL (ref 150–400)
RBC: 3.94 MIL/uL (ref 3.87–5.11)
RDW: 13.6 % (ref 11.5–15.5)
WBC: 6.4 10*3/uL (ref 4.0–10.5)

## 2017-03-15 LAB — BASIC METABOLIC PANEL
Anion gap: 8 (ref 5–15)
BUN: 31 mg/dL — AB (ref 6–20)
CALCIUM: 9.4 mg/dL (ref 8.9–10.3)
CO2: 28 mmol/L (ref 22–32)
CREATININE: 1.2 mg/dL — AB (ref 0.44–1.00)
Chloride: 102 mmol/L (ref 101–111)
GFR calc Af Amer: 46 mL/min — ABNORMAL LOW (ref >60)
GFR, EST NON AFRICAN AMERICAN: 40 mL/min — AB (ref >60)
Glucose, Bld: 95 mg/dL (ref 65–99)
Potassium: 4 mmol/L (ref 3.5–5.1)
SODIUM: 138 mmol/L (ref 135–145)

## 2017-03-15 LAB — SURGICAL PCR SCREEN
MRSA, PCR: NEGATIVE
Staphylococcus aureus: POSITIVE — AB

## 2017-03-15 NOTE — Pre-Procedure Instructions (Addendum)
SEE BEHARRY  03/15/2017      [id number] - [place], [place] [address] Phone: [phone] Fax: [phone]  [place] [address] [place] [address] [place] [address] Phone: [phone] Fax: [phone]    Your procedure is scheduled on 03/17/17.  Report to [facility] Admitting at  1:00 P.M.  Call this number if you have problems the morning of surgery:  [phone]   Remember:  Do not eat food or drink liquids after midnight.  Take these medicines the morning of surgery with A SIP OF WATER     Duloxetine(cymbalta),gabapentin,levothyroxine,lorazepam if needed,oxycodone if needed  STOP all herbel meds, nsaids (aleve,naproxen,advil,ibuprofen) prior to surgery starting today 03/15/17 including all vitamins/supplements,aspirin diclofenac(voltaren),fish oil tumeric,zinc   Do not wear jewelry, make-up or nail polish.  Do not wear lotions, powders, or perfumes, or deoderant.  Do not shave 48 hours prior to surgery.  Men may shave face and neck.  Do not bring valuables to the hospital.  [facility] is not responsible for any belongings or valuables.  Contacts, dentures or bridgework may not be worn into surgery.  Leave your suitcase in the car.  After surgery it may be brought to your room.  For patients admitted to the hospital, discharge time will be determined by your treatment team.  Patients discharged the day of surgery will not be allowed to drive home.   Special instructions:   Special Instructions: [place] - Preparing for Surgery  Before surgery, you can play an important role.  Because skin is not sterile, your skin needs to be as free of germs as possible.  You can reduce the number of germs on you skin by washing with CHG (chlorahexidine gluconate) soap before surgery.  CHG is an antiseptic cleaner which kills germs and bonds with the skin to continue  killing germs even after washing.  Please DO NOT use if you have an allergy to CHG or antibacterial soaps.  If your skin becomes reddened/irritated stop using the CHG and inform your nurse when you arrive at Short Stay.  Do not shave (including legs and underarms) for at least 48 hours prior to the first CHG shower.  You may shave your face.  Please follow these instructions carefully:   1.  Shower with CHG Soap the night before surgery and the morning of Surgery.  2.  If you choose to wash your hair, wash your hair first as usual with your normal shampoo.  3.  After you shampoo, rinse your hair and body thoroughly to remove the Shampoo.  4.  Use CHG as you would any other liquid soap.  You can apply chg directly  to the skin and wash gently with scrungie or a clean washcloth.  5.  Apply the CHG Soap to your body ONLY FROM THE NECK DOWN.  Do not use on open wounds or open sores.  Avoid contact with your eyes ears, mouth and genitals (private parts).  Wash genitals (private parts)       with your normal soap.  6.  Wash thoroughly, paying special attention to the area where your surgery will be performed.  7.  Thoroughly rinse your body with warm water from the neck down.  8.  DO NOT shower/wash with your normal soap after using and rinsing off the CHG Soap.  9.  Pat yourself dry with a clean  towel.            10.  Wear clean pajamas.            11.  Place clean sheets on your bed the night of your first shower and do not sleep with pets.  Day of Surgery  Do not apply any lotions/deodorants the morning of surgery.  Please wear clean clothes to the hospital/surgery center.  Please read over the fact sheets that you were given.

## 2017-03-16 MED ORDER — CEFAZOLIN SODIUM-DEXTROSE 2-4 GM/100ML-% IV SOLN
2.0000 g | INTRAVENOUS | Status: AC
  Administered 2017-03-17: 2 g via INTRAVENOUS
  Filled 2017-03-16: qty 100

## 2017-03-17 ENCOUNTER — Inpatient Hospital Stay (HOSPITAL_COMMUNITY): Payer: Medicare Other | Admitting: Anesthesiology

## 2017-03-17 ENCOUNTER — Inpatient Hospital Stay (HOSPITAL_COMMUNITY): Payer: Medicare Other

## 2017-03-17 ENCOUNTER — Observation Stay (HOSPITAL_COMMUNITY)
Admission: RE | Admit: 2017-03-17 | Discharge: 2017-03-18 | Disposition: A | Discharge status: Home / Self Care | Payer: Medicare Other | Source: Ambulatory Visit | Attending: Orthopedic Surgery | Admitting: Orthopedic Surgery

## 2017-03-17 ENCOUNTER — Encounter (HOSPITAL_COMMUNITY): Payer: Self-pay | Admitting: *Deleted

## 2017-03-17 ENCOUNTER — Encounter (HOSPITAL_COMMUNITY)
Admission: RE | Disposition: A | Discharge status: Home / Self Care | Payer: Self-pay | Source: Ambulatory Visit | Attending: Orthopedic Surgery

## 2017-03-17 DIAGNOSIS — Z96651 Presence of right artificial knee joint: Secondary | ICD-10-CM | POA: Insufficient documentation

## 2017-03-17 DIAGNOSIS — G894 Chronic pain syndrome: Principal | ICD-10-CM | POA: Insufficient documentation

## 2017-03-17 DIAGNOSIS — M5116 Intervertebral disc disorders with radiculopathy, lumbar region: Secondary | ICD-10-CM | POA: Diagnosis not present

## 2017-03-17 DIAGNOSIS — M48061 Spinal stenosis, lumbar region without neurogenic claudication: Secondary | ICD-10-CM | POA: Insufficient documentation

## 2017-03-17 DIAGNOSIS — Z8249 Family history of ischemic heart disease and other diseases of the circulatory system: Secondary | ICD-10-CM | POA: Insufficient documentation

## 2017-03-17 DIAGNOSIS — Z7982 Long term (current) use of aspirin: Secondary | ICD-10-CM | POA: Insufficient documentation

## 2017-03-17 DIAGNOSIS — Z9689 Presence of other specified functional implants: Secondary | ICD-10-CM

## 2017-03-17 DIAGNOSIS — M25551 Pain in right hip: Secondary | ICD-10-CM | POA: Insufficient documentation

## 2017-03-17 DIAGNOSIS — Z87891 Personal history of nicotine dependence: Secondary | ICD-10-CM | POA: Insufficient documentation

## 2017-03-17 DIAGNOSIS — I447 Left bundle-branch block, unspecified: Secondary | ICD-10-CM | POA: Insufficient documentation

## 2017-03-17 DIAGNOSIS — E039 Hypothyroidism, unspecified: Secondary | ICD-10-CM | POA: Insufficient documentation

## 2017-03-17 DIAGNOSIS — I10 Essential (primary) hypertension: Secondary | ICD-10-CM | POA: Insufficient documentation

## 2017-03-17 DIAGNOSIS — Z823 Family history of stroke: Secondary | ICD-10-CM | POA: Insufficient documentation

## 2017-03-17 DIAGNOSIS — Z96653 Presence of artificial knee joint, bilateral: Secondary | ICD-10-CM | POA: Insufficient documentation

## 2017-03-17 DIAGNOSIS — M1990 Unspecified osteoarthritis, unspecified site: Secondary | ICD-10-CM | POA: Insufficient documentation

## 2017-03-17 DIAGNOSIS — M7061 Trochanteric bursitis, right hip: Secondary | ICD-10-CM | POA: Insufficient documentation

## 2017-03-17 DIAGNOSIS — Z981 Arthrodesis status: Secondary | ICD-10-CM | POA: Insufficient documentation

## 2017-03-17 DIAGNOSIS — Z419 Encounter for procedure for purposes other than remedying health state, unspecified: Secondary | ICD-10-CM

## 2017-03-17 DIAGNOSIS — M549 Dorsalgia, unspecified: Secondary | ICD-10-CM | POA: Diagnosis present

## 2017-03-17 DIAGNOSIS — F419 Anxiety disorder, unspecified: Secondary | ICD-10-CM | POA: Insufficient documentation

## 2017-03-17 DIAGNOSIS — Z9889 Other specified postprocedural states: Secondary | ICD-10-CM

## 2017-03-17 DIAGNOSIS — Z79899 Other long term (current) drug therapy: Secondary | ICD-10-CM | POA: Insufficient documentation

## 2017-03-17 DIAGNOSIS — Z85828 Personal history of other malignant neoplasm of skin: Secondary | ICD-10-CM | POA: Insufficient documentation

## 2017-03-17 HISTORY — PX: SPINAL CORD STIMULATOR INSERTION: SHX5378

## 2017-03-17 SURGERY — INSERTION, SPINAL CORD STIMULATOR, LUMBAR
Anesthesia: General | Site: Spine Thoracic

## 2017-03-17 MED ORDER — LEVOTHYROXINE SODIUM 100 MCG PO TABS
50.0000 ug | ORAL_TABLET | Freq: Every day | ORAL | Status: DC
  Administered 2017-03-18: 50 ug via ORAL
  Filled 2017-03-17: qty 1

## 2017-03-17 MED ORDER — BUPIVACAINE-EPINEPHRINE (PF) 0.25% -1:200000 IJ SOLN
INTRAMUSCULAR | Status: AC
  Filled 2017-03-17: qty 30

## 2017-03-17 MED ORDER — ONDANSETRON HCL 4 MG PO TABS: 4.0000 mg | ORAL_TABLET | Freq: Three times a day (TID) | ORAL | 0 refills | Status: AC | PRN

## 2017-03-17 MED ORDER — ONDANSETRON HCL 4 MG/2ML IJ SOLN
INTRAMUSCULAR | Status: DC | PRN
  Administered 2017-03-17: 4 mg via INTRAVENOUS

## 2017-03-17 MED ORDER — PROPOFOL 10 MG/ML IV BOLUS
INTRAVENOUS | Status: DC | PRN
  Administered 2017-03-17: 90 mg via INTRAVENOUS

## 2017-03-17 MED ORDER — DEXAMETHASONE SODIUM PHOSPHATE 10 MG/ML IJ SOLN
INTRAMUSCULAR | Status: AC
  Filled 2017-03-17: qty 1

## 2017-03-17 MED ORDER — HYDROCHLOROTHIAZIDE 12.5 MG PO CAPS: 12.5000 mg | ORAL_CAPSULE | Freq: Every day | ORAL | Status: DC

## 2017-03-17 MED ORDER — SUGAMMADEX SODIUM 200 MG/2ML IV SOLN
INTRAVENOUS | Status: AC
  Filled 2017-03-17: qty 2

## 2017-03-17 MED ORDER — FESOTERODINE FUMARATE ER 8 MG PO TB24
8.0000 mg | ORAL_TABLET | Freq: Every day | ORAL | Status: DC
  Filled 2017-03-17: qty 1

## 2017-03-17 MED ORDER — LACTATED RINGERS IV SOLN
INTRAVENOUS | Status: DC
  Administered 2017-03-17 (×2): via INTRAVENOUS

## 2017-03-17 MED ORDER — METHOCARBAMOL 1000 MG/10ML IJ SOLN
500.0000 mg | Freq: Four times a day (QID) | INTRAVENOUS | Status: DC | PRN
  Filled 2017-03-17: qty 5

## 2017-03-17 MED ORDER — ROCURONIUM BROMIDE 100 MG/10ML IV SOLN
INTRAVENOUS | Status: DC | PRN
  Administered 2017-03-17: 50 mg via INTRAVENOUS

## 2017-03-17 MED ORDER — LISINOPRIL-HYDROCHLOROTHIAZIDE 20-12.5 MG PO TABS: 1.0000 | ORAL_TABLET | Freq: Every day | ORAL | Status: DC

## 2017-03-17 MED ORDER — ONDANSETRON HCL 4 MG/2ML IJ SOLN: 4.0000 mg | Freq: Four times a day (QID) | INTRAMUSCULAR | Status: DC | PRN

## 2017-03-17 MED ORDER — ROCURONIUM BROMIDE 10 MG/ML (PF) SYRINGE
PREFILLED_SYRINGE | INTRAVENOUS | Status: AC
  Filled 2017-03-17: qty 5

## 2017-03-17 MED ORDER — FENTANYL CITRATE (PF) 250 MCG/5ML IJ SOLN
INTRAMUSCULAR | Status: AC
  Filled 2017-03-17: qty 5

## 2017-03-17 MED ORDER — MEPERIDINE HCL 25 MG/ML IJ SOLN: 6.2500 mg | INTRAMUSCULAR | Status: DC | PRN

## 2017-03-17 MED ORDER — FENTANYL CITRATE (PF) 100 MCG/2ML IJ SOLN
25.0000 ug | INTRAMUSCULAR | Status: DC | PRN
  Administered 2017-03-17: 50 ug via INTRAVENOUS

## 2017-03-17 MED ORDER — ACETAMINOPHEN 650 MG RE SUPP: 650.0000 mg | RECTAL | Status: DC | PRN

## 2017-03-17 MED ORDER — SODIUM CHLORIDE 0.9% FLUSH
3.0000 mL | Freq: Two times a day (BID) | INTRAVENOUS | Status: DC
  Administered 2017-03-17: 3 mL via INTRAVENOUS

## 2017-03-17 MED ORDER — LIDOCAINE HCL (CARDIAC) 20 MG/ML IV SOLN
INTRAVENOUS | Status: DC | PRN
  Administered 2017-03-17: 100 mg via INTRAVENOUS

## 2017-03-17 MED ORDER — ONDANSETRON HCL 4 MG PO TABS: 4.0000 mg | ORAL_TABLET | Freq: Four times a day (QID) | ORAL | Status: DC | PRN

## 2017-03-17 MED ORDER — THROMBIN 20000 UNITS EX SOLR
CUTANEOUS | Status: AC
  Filled 2017-03-17: qty 20000

## 2017-03-17 MED ORDER — DEXAMETHASONE 4 MG PO TABS
4.0000 mg | ORAL_TABLET | Freq: Four times a day (QID) | ORAL | Status: AC
  Administered 2017-03-17 (×2): 4 mg via ORAL
  Filled 2017-03-17 (×2): qty 1

## 2017-03-17 MED ORDER — ACETAMINOPHEN 325 MG PO TABS: 650.0000 mg | ORAL_TABLET | ORAL | Status: DC | PRN

## 2017-03-17 MED ORDER — OXYCODONE-ACETAMINOPHEN 10-325 MG PO TABS: 1.0000 | ORAL_TABLET | ORAL | 0 refills | Status: AC | PRN

## 2017-03-17 MED ORDER — CEFAZOLIN SODIUM-DEXTROSE 2-4 GM/100ML-% IV SOLN
2.0000 g | Freq: Three times a day (TID) | INTRAVENOUS | Status: AC
  Administered 2017-03-17 – 2017-03-18 (×2): 2 g via INTRAVENOUS
  Filled 2017-03-17 (×2): qty 100

## 2017-03-17 MED ORDER — MORPHINE SULFATE (PF) 4 MG/ML IV SOLN: 2.0000 mg | INTRAVENOUS | Status: DC | PRN

## 2017-03-17 MED ORDER — ACETAMINOPHEN 10 MG/ML IV SOLN
1000.0000 mg | INTRAVENOUS | Status: AC
  Administered 2017-03-17: 1000 mg via INTRAVENOUS
  Filled 2017-03-17: qty 100

## 2017-03-17 MED ORDER — DEXAMETHASONE SODIUM PHOSPHATE 10 MG/ML IJ SOLN
INTRAMUSCULAR | Status: DC | PRN
  Administered 2017-03-17: 10 mg via INTRAVENOUS

## 2017-03-17 MED ORDER — LISINOPRIL 20 MG PO TABS: 20.0000 mg | ORAL_TABLET | Freq: Every day | ORAL | Status: DC

## 2017-03-17 MED ORDER — LIDOCAINE 2% (20 MG/ML) 5 ML SYRINGE
INTRAMUSCULAR | Status: AC
  Filled 2017-03-17: qty 5

## 2017-03-17 MED ORDER — PROPOFOL 10 MG/ML IV BOLUS
INTRAVENOUS | Status: AC
  Filled 2017-03-17: qty 20

## 2017-03-17 MED ORDER — AMLODIPINE BESYLATE 5 MG PO TABS: 5.0000 mg | ORAL_TABLET | Freq: Every evening | ORAL | Status: DC

## 2017-03-17 MED ORDER — DULOXETINE HCL 30 MG PO CPEP: 30.0000 mg | ORAL_CAPSULE | Freq: Every morning | ORAL | Status: DC

## 2017-03-17 MED ORDER — METHOCARBAMOL 500 MG PO TABS
500.0000 mg | ORAL_TABLET | Freq: Four times a day (QID) | ORAL | Status: DC | PRN
  Administered 2017-03-17 – 2017-03-18 (×2): 500 mg via ORAL
  Filled 2017-03-17 (×2): qty 1

## 2017-03-17 MED ORDER — 0.9 % SODIUM CHLORIDE (POUR BTL) OPTIME
TOPICAL | Status: DC | PRN
  Administered 2017-03-17: 1000 mL

## 2017-03-17 MED ORDER — BUPIVACAINE-EPINEPHRINE 0.25% -1:200000 IJ SOLN
INTRAMUSCULAR | Status: DC | PRN
  Administered 2017-03-17: 10 mL

## 2017-03-17 MED ORDER — PHENOL 1.4 % MT LIQD: 1.0000 | OROMUCOSAL | Status: DC | PRN

## 2017-03-17 MED ORDER — FENTANYL CITRATE (PF) 100 MCG/2ML IJ SOLN
INTRAMUSCULAR | Status: DC | PRN
  Administered 2017-03-17 (×3): 50 ug via INTRAVENOUS

## 2017-03-17 MED ORDER — LACTATED RINGERS IV SOLN: INTRAVENOUS | Status: DC

## 2017-03-17 MED ORDER — GABAPENTIN 300 MG PO CAPS
600.0000 mg | ORAL_CAPSULE | Freq: Three times a day (TID) | ORAL | Status: DC
  Administered 2017-03-17 (×2): 600 mg via ORAL
  Filled 2017-03-17 (×3): qty 2

## 2017-03-17 MED ORDER — LORAZEPAM 0.5 MG PO TABS: 0.5000 mg | ORAL_TABLET | Freq: Three times a day (TID) | ORAL | Status: DC | PRN

## 2017-03-17 MED ORDER — ONDANSETRON HCL 4 MG/2ML IJ SOLN
INTRAMUSCULAR | Status: AC
  Filled 2017-03-17: qty 2

## 2017-03-17 MED ORDER — MENTHOL 3 MG MT LOZG: 1.0000 | LOZENGE | OROMUCOSAL | Status: DC | PRN

## 2017-03-17 MED ORDER — POLYETHYLENE GLYCOL 3350 17 G PO PACK: 17.0000 g | PACK | Freq: Every day | ORAL | Status: DC | PRN

## 2017-03-17 MED ORDER — OXYCODONE HCL 5 MG PO TABS
10.0000 mg | ORAL_TABLET | ORAL | Status: DC | PRN
  Administered 2017-03-17 – 2017-03-18 (×4): 10 mg via ORAL
  Filled 2017-03-17 (×4): qty 2

## 2017-03-17 MED ORDER — SODIUM CHLORIDE 0.9 % IV SOLN: 250.0000 mL | INTRAVENOUS | Status: DC

## 2017-03-17 MED ORDER — METOCLOPRAMIDE HCL 5 MG/ML IJ SOLN: 10.0000 mg | Freq: Once | INTRAMUSCULAR | Status: DC | PRN

## 2017-03-17 MED ORDER — SUGAMMADEX SODIUM 200 MG/2ML IV SOLN
INTRAVENOUS | Status: DC | PRN
  Administered 2017-03-17: 120 mg via INTRAVENOUS

## 2017-03-17 MED ORDER — SODIUM CHLORIDE 0.9% FLUSH: 3.0000 mL | INTRAVENOUS | Status: DC | PRN

## 2017-03-17 MED ORDER — HEMOSTATIC AGENTS (NO CHARGE) OPTIME
TOPICAL | Status: DC | PRN
  Administered 2017-03-17: 1 via TOPICAL

## 2017-03-17 MED ORDER — FENTANYL CITRATE (PF) 100 MCG/2ML IJ SOLN
INTRAMUSCULAR | Status: AC
  Filled 2017-03-17: qty 2

## 2017-03-17 MED ORDER — DEXAMETHASONE SODIUM PHOSPHATE 4 MG/ML IJ SOLN
4.0000 mg | Freq: Four times a day (QID) | INTRAMUSCULAR | Status: AC
  Administered 2017-03-17: 4 mg via INTRAVENOUS
  Filled 2017-03-17: qty 1

## 2017-03-17 SURGICAL SUPPLY — 55 items
CANISTER SUCT 3000ML PPV (MISCELLANEOUS) ×2 IMPLANT
CLSR STERI-STRIP ANTIMIC 1/2X4 (GAUZE/BANDAGES/DRESSINGS) ×4 IMPLANT
CONTROLLER PROCLAIM IPG E5 PAT (Neuro Prosthesis/Implant) ×2 IMPLANT
COVER PROBE W GEL 5X96 (DRAPES) IMPLANT
COVER SURGICAL LIGHT HANDLE (MISCELLANEOUS) ×2 IMPLANT
DRAPE C-ARM 42X72 X-RAY (DRAPES) ×2 IMPLANT
DRAPE INCISE IOBAN 85X60 (DRAPES) ×2 IMPLANT
DRAPE SURG 17X23 STRL (DRAPES) IMPLANT
DRAPE U-SHAPE 47X51 STRL (DRAPES) ×4 IMPLANT
DRSG AQUACEL AG ADV 3.5X 6 (GAUZE/BANDAGES/DRESSINGS) ×2 IMPLANT
DURAPREP 26ML APPLICATOR (WOUND CARE) ×2 IMPLANT
ELECT BLADE 4.0 EZ CLEAN MEGAD (MISCELLANEOUS)
ELECT CAUTERY BLADE 6.4 (BLADE) ×2 IMPLANT
ELECT PENCIL ROCKER SW 15FT (MISCELLANEOUS) ×2 IMPLANT
ELECT REM PT RETURN 9FT ADLT (ELECTROSURGICAL) ×2
ELECTRODE BLDE 4.0 EZ CLN MEGD (MISCELLANEOUS) IMPLANT
ELECTRODE REM PT RTRN 9FT ADLT (ELECTROSURGICAL) ×1 IMPLANT
GLOVE BIO SURGEON STRL SZ7 (GLOVE) ×2 IMPLANT
GLOVE BIOGEL PI IND STRL 7.0 (GLOVE) ×1 IMPLANT
GLOVE BIOGEL PI IND STRL 8.5 (GLOVE) ×1 IMPLANT
GLOVE BIOGEL PI INDICATOR 7.0 (GLOVE) ×1
GLOVE BIOGEL PI INDICATOR 8.5 (GLOVE) ×1
GLOVE SS N UNI LF 8.5 STRL (GLOVE) ×4 IMPLANT
GOWN STRL REUS W/ TWL LRG LVL3 (GOWN DISPOSABLE) ×2 IMPLANT
GOWN STRL REUS W/TWL 2XL LVL3 (GOWN DISPOSABLE) ×2 IMPLANT
GOWN STRL REUS W/TWL LRG LVL3 (GOWN DISPOSABLE) ×2
KIT BASIN OR (CUSTOM PROCEDURE TRAY) ×2 IMPLANT
KIT ROOM TURNOVER OR (KITS) ×2 IMPLANT
LAMI NARROW PRIPOLE 16CH (Neuro Prosthesis/Implant) ×2 IMPLANT
NEEDLE 22X1 1/2 (OR ONLY) (NEEDLE) ×4 IMPLANT
NEEDLE SPNL 18GX3.5 QUINCKE PK (NEEDLE) IMPLANT
NS IRRIG 1000ML POUR BTL (IV SOLUTION) ×2 IMPLANT
PACK LAMINECTOMY ORTHO (CUSTOM PROCEDURE TRAY) ×2 IMPLANT
PACK UNIVERSAL I (CUSTOM PROCEDURE TRAY) ×2 IMPLANT
PAD ARMBOARD 7.5X6 YLW CONV (MISCELLANEOUS) ×6 IMPLANT
SPATULA SILICONE BRAIN 10MM (MISCELLANEOUS) ×2 IMPLANT
SPONGE LAP 4X18 X RAY DECT (DISPOSABLE) IMPLANT
SPONGE SURGIFOAM ABS GEL 100 (HEMOSTASIS) IMPLANT
STAPLER VISISTAT 35W (STAPLE) IMPLANT
SURGIFLO W/THROMBIN 8M KIT (HEMOSTASIS) ×2 IMPLANT
SUT BONE WAX W31G (SUTURE) IMPLANT
SUT ETHIBOND 2 OS 4 DA (SUTURE) ×2 IMPLANT
SUT FIBERWIRE #2 38 T-5 BLUE (SUTURE)
SUT MNCRL AB 3-0 PS2 18 (SUTURE) ×4 IMPLANT
SUT VIC AB 1 CT1 18XCR BRD 8 (SUTURE) ×2 IMPLANT
SUT VIC AB 1 CT1 8-18 (SUTURE) ×2
SUT VIC AB 2-0 CT1 18 (SUTURE) ×2 IMPLANT
SUTURE FIBERWR #2 38 T-5 BLUE (SUTURE) IMPLANT
SYR BULB IRRIGATION 50ML (SYRINGE) ×2 IMPLANT
SYR CONTROL 10ML LL (SYRINGE) ×2 IMPLANT
TOOL TUNNELING 20 INCH (ORTHOPEDIC DISPOSABLE SUPPLIES) ×2 IMPLANT
TOWEL OR 17X24 6PK STRL BLUE (TOWEL DISPOSABLE) ×2 IMPLANT
TOWEL OR 17X26 10 PK STRL BLUE (TOWEL DISPOSABLE) ×2 IMPLANT
TRAY FOLEY W/METER SILVER 16FR (SET/KITS/TRAYS/PACK) IMPLANT
WATER STERILE IRR 1000ML POUR (IV SOLUTION) IMPLANT

## 2017-03-17 NOTE — Brief Op Note (Signed)
03/17/2017  11:41 AM  PATIENT:  Sharon Gordon  83 y.o. female  PRE-OPERATIVE DIAGNOSIS:  Chronic back pain with neuropathic left leg pain  POST-OPERATIVE DIAGNOSIS:  Chronic back pain with neuropathic left leg pain  PROCEDURE:  Procedure(s) with comments: LUMBAR SPINAL CORD STIMULATOR INSERTION (N/A) - 2 hrs  SURGEON:  Surgeon(s) and Role:    Melina Schools, MD - Primary  PHYSICIAN ASSISTANT:   ASSISTANTS: Carmen Mayo   ANESTHESIA:   general  EBL:  Total I/O In: 1000 [I.V.:1000] Out: -   BLOOD ADMINISTERED:none  DRAINS: none   LOCAL MEDICATIONS USED:  MARCAINE     SPECIMEN:  No Specimen  DISPOSITION OF SPECIMEN:  N/A  COUNTS:  YES  TOURNIQUET:  * No tourniquets in log *  DICTATION: .Dragon Dictation  PLAN OF CARE: Admit to inpatient   PATIENT DISPOSITION:  PACU - hemodynamically stable.

## 2017-03-17 NOTE — Care Management Obs Status (Signed)
[facility] NOTIFICATION   Patient Details  Name: [name] MRN: [id number] Date of Birth: Sep 27, 1928   Medicare Observation Status Notification Given:  Yes    Ninfa Meeker, RN 03/17/2017, 3:21 PM

## 2017-03-17 NOTE — Evaluation (Signed)
Physical Therapy Evaluation and Discharge Patient Details Name: Sharon Gordon MRN: [id number] DOB: [birth year] Today's Date: 03/17/2017   History of Present Illness  Pt is an 83 y/o female s/p lumbar spinal cord stimulator insertion. PMH including but not limited to hx of bilateral TKA.  Clinical Impression  Pt presented supine in bed with HOB elevated, awake and willing to participate in therapy session. Prior to admission, pt reported that she ambulated with a SPC for community distance and was independent with ADLs. Pt lives with her husband who she reports has dementia. Her daughter is planning to stay with her for a week upon d/c. Pt ambulated in hallway with [facility] and supervision to min guard. Pt successfully completed stair training as well. No further acute PT needs identified at this time. PT signing off.     Follow Up Recommendations No PT follow up;Supervision - Intermittent    Equipment Recommendations  None recommended by PT    Recommendations for Other Services       Precautions / Restrictions Restrictions Weight Bearing Restrictions: No      Mobility  Bed Mobility Overal bed mobility: Modified Independent                Transfers Overall transfer level: Modified independent Equipment used: Straight cane                Ambulation/Gait Ambulation/Gait assistance: Supervision;Min guard Ambulation Distance (Feet): 300 Feet Assistive device: Straight cane Gait Pattern/deviations: Step-through pattern;Decreased stride length Gait velocity: decreased   General Gait Details: mild instability but no overt LOB or need for physical assistance, supervision to min guard for safety  Stairs Stairs: Yes Stairs assistance: Supervision Stair Management: One rail Right;Step to pattern;Forwards Number of Stairs: 4 General stair comments: mild instability but no overt LOB or need for physical assistance  Wheelchair Mobility    Modified Rankin (Stroke Patients  Only)       Balance Overall balance assessment: Needs assistance Sitting-balance support: Feet supported Sitting balance-Leahy Scale: Good     Standing balance support: During functional activity;No upper extremity supported Standing balance-Leahy Scale: Fair                               Pertinent Vitals/Pain Pain Assessment: Faces Faces Pain Scale: Hurts a little bit Pain Location: incision site Pain Descriptors / Indicators: Sore Pain Intervention(s): Monitored during session;Repositioned    Home Living Family/patient expects to be discharged to:: Private residence Living Arrangements: Spouse/significant other Available Help at Discharge: Family;Available 24 hours/day (daughter will be staying with pt for one week) Type of Home: House Home Access: Ramped entrance     Home Layout: Laundry or work area in [place]: Environmental consultant - 2 wheels;Cane - single point;Bedside commode      Prior Function Level of Independence: Independent with assistive device(s)         Comments: pt reported that she would use a SPC to ambulate within her community     Hand Dominance        Extremity/Trunk Assessment   Upper Extremity Assessment Upper Extremity Assessment: Defer to OT evaluation    Lower Extremity Assessment Lower Extremity Assessment: Overall WFL for tasks assessed    Cervical / Trunk Assessment Cervical / Trunk Assessment: Kyphotic  Communication   Communication: No difficulties  Cognition Arousal/Alertness: Awake/alert Behavior During Therapy: WFL for tasks assessed/performed Overall Cognitive Status: Within Functional Limits for tasks assessed  General Comments      Exercises     Assessment/Plan    PT Assessment Patent does not need any further PT services  PT Problem List         PT Treatment Interventions      PT Goals (Current goals can be found in the Care Plan  section)  Acute Rehab PT Goals Patient Stated Goal: return home and get back into walking regularly    Frequency     Barriers to discharge        Co-evaluation               AM-PAC PT "6 Clicks" Daily Activity  Outcome Measure Difficulty turning over in bed (including adjusting bedclothes, sheets and blankets)?: None Difficulty moving from lying on back to sitting on the side of the bed? : None Difficulty sitting down on and standing up from a chair with arms (e.g., wheelchair, bedside commode, etc,.)?: None Help needed moving to and from a bed to chair (including a wheelchair)?: A Little Help needed walking in hospital room?: A Little Help needed climbing 3-5 steps with a railing? : A Little 6 Click Score: 21    End of Session Equipment Utilized During Treatment: Gait belt Activity Tolerance: Patient tolerated treatment well Patient left: in chair;with call bell/phone within reach Nurse Communication: Mobility status PT Visit Diagnosis: Other abnormalities of gait and mobility (R26.89);Pain Pain - part of body:  (back)    Time: 1631-1650 PT Time Calculation (min) (ACUTE ONLY): 19 min   Charges:   PT Evaluation $PT Eval Moderate Complexity: 1 Procedure     PT G Codes:   PT G-Codes **NOT FOR INPATIENT CLASS** Functional Assessment Tool Used: AM-PAC 6 Clicks Basic Mobility;Clinical judgement Functional Limitation: Mobility: Walking and moving around Mobility: Walking and Moving Around Current Status (L4562): At least 20 percent but less than 40 percent impaired, limited or restricted Mobility: Walking and Moving Around Goal Status [phone]): 0 percent impaired, limited or restricted Mobility: Walking and Moving Around Discharge Status [phone]): At least 20 percent but less than 40 percent impaired, limited or restricted    [facility], [place], DPT [place] 03/17/2017, 5:26 PM

## 2017-03-17 NOTE — H&P (Signed)
History of Present Illness  The patient is a 83 year old female who comes in today for a preoperative History and Physical. The patient is scheduled for a Implantation of Spinal Cord Stimulator to be performed by Dr. Duane Lope D. Rolena Infante, MD at [facility] on 03/17/17 . Please see the hospital record for complete dictated history and physical. Pt reports a hx of good health.  Problem List/Past Medical  Aftercare following left knee joint replacement surgery (Z47.1)  Status post total right knee replacement (Z96.651)  Trochanteric bursitis of right hip (M70.61)  Acute pain of right knee (M25.561)  Effusion of right knee (M25.461)  Primary osteoarthritis of left knee (M17.12)  Encounter for care following Knee Arthroscopy (Z47.89)  right Degenerative lumbar disc (M51.36)  Other bursitis of knee, right knee (M70.51)  Pes anserine bursitis Lumbar radiculopathy (M54.16)  Lateral spinal stenosis (M48.00)  Pain of right hip joint (M25.551)  Complex tear of lateral meniscus of right knee as current injury, subsequent encounter (T61.443X)  Chronic pain syndrome (G89.4)  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 former smoker; smoke(d) 3 or more pack(s) per day; uses 2 or more can(s) smokeless per week Tobacco / smoke exposure  no  Medication History  Percocet (5-325MG  Tablet, 1 (one) Oral four times daily, as needed, Taken starting 02/15/2017) Active. AmLODIPine Besylate (5MG  Tablet, Oral) Active. Detrol LA (2MG  Capsule ER 24HR, Oral) Active. (prn) LORazepam (0.5MG  Tablet, Oral) Active. (prmn) Methocarbamol (500MG  Tablet, Oral) Active. Aspirin  (Oral) Specific strength unknown - Active. (prn) DULoxetine HCl (30MG  Capsule DR Part, Oral daily) Active. (qd) AmLODIPine Besylate (10MG  Tablet, Oral daily) Active. (qd) Levothyroxine Sodium (50MCG Tablet, Oral) Active. (qd) Gabapentin (300MG  Capsule, 2 Oral three times daily) Active. Lisinopril-Hydrochlorothiazide (20-12.5MG  Tablet, Oral every 12 hrs) Active. (qd) Medications Reconciled  Vitals  03/14/2017 3:16 PM Weight: 130 lb Height: 64in Body Surface Area: 1.63 m Body Mass Index: 22.31 kg/m  Temp.: 98.101F  Pulse: 80 (Regular)  BP: 130/86 (Sitting, Right Arm, Standard)  General General Appearance-Not in acute distress. Orientation-Oriented X3. Build & Nutrition-Well nourished and Well developed.  Integumentary General Characteristics Surgical Scars - no surgical scar evidence of previous lumbar surgery. Lumbar Spine-Skin examination of the lumbar spine is without deformity, skin lesions, lacerations or abrasions.  Chest and Lung Exam Auscultation Breath sounds - Normal and Clear.  Cardiovascular Auscultation Rhythm - Regular rate and rhythm.  Abdomen Palpation/Percussion Palpation and Percussion of the abdomen reveal - Soft, Non Tender and No Rebound tenderness.  Peripheral Vascular Lower Extremity Palpation - Posterior tibial pulse - Bilateral - 2+. Dorsalis pedis pulse - Bilateral - 2+.  Neurologic Sensation Lower Extremity - Bilateral - sensation is intact in the lower extremity. Reflexes Patellar Reflex - Bilateral - 2+. Achilles Reflex - Bilateral - 2+. Clonus - Bilateral - clonus not present. Hoffman's Sign - Bilateral - Hoffman's sign not present. Testing Seated Straight Leg Raise - Bilateral - Seated straight leg raise negative.  Musculoskeletal Spine/Ribs/Pelvis  Lumbosacral Spine: Inspection and Palpation - Tenderness - left lumbar paraspinals tender to palpation. Strength and Tone: Strength - Hip Flexion - Bilateral -  5/5. Knee Extension - Bilateral - 5/5. Knee Flexion - Bilateral - 5/5. Ankle Dorsiflexion -  Bilateral - 5/5. Ankle Plantarflexion - Bilateral - 5/5. ROM - Flexion - moderately decreased range of motion and painful. Extension - moderately decreased range of motion and painful. Left Lateral Bending - moderately decreased range of motion and painful. Right Lateral Bending - moderately decreased range of motion and painful. Right Rotation - moderately decreased range of motion and painful. Left Rotation - moderately decreased range of motion and painful. Pain - neither flexion or extension is more painful than the other. Lumbosacral Spine - Waddell's Signs - no Waddell's signs present. Lower Extremity Range of Motion - No true hip, knee or ankle pain with range of motion. Gait and Station - [employer] - no assistive devices.  Thoracic MRI was reviewed. No contraindications for implantation spinal cord stimulator.  At this point patient's chronic debilitating pain and has been unsuccessfully treated with conservative measures. She had a successful trial spinal cord stimulator placement and presented to me for definitive implantation. We reviewed the risks and benefits which include infection, bleeding, death, stroke, paralysis, migration of the lead, ongoing or worse pain, battery failure, and need for additional surgery. All her questions were addressed and we'll plan on  permanent stimulator placement today

## 2017-03-17 NOTE — Care Management CC44 (Signed)
Condition Code 44 Documentation Completed  Patient Details  Name: BRIGGETTE NAJARIAN MRN: [id number] Date of Birth: 10/22/1927   Condition Code 44 given:  Yes Patient signature on Condition Code 44 notice:  Yes Documentation of 2 MD's agreement:  Yes Code 44 added to claim:  Yes    Ninfa Meeker, RN 03/17/2017, 3:21 PM

## 2017-03-17 NOTE — Transfer of Care (Signed)
Immediate Anesthesia Transfer of Care Note  Patient: Sharon Gordon  Procedure(s) Performed: Procedure(s) with comments: LUMBAR SPINAL CORD STIMULATOR INSERTION (N/A) - 2 hrs  Patient Location: PACU  Anesthesia Type:General  Level of Consciousness: oriented, sedated and patient cooperative  Airway & Oxygen Therapy: Patient Spontanous Breathing and Patient connected to face mask oxygen  Post-op Assessment: Report given to RN and Post -op Vital signs reviewed and stable  Post vital signs: Reviewed  Last Vitals:  Vitals:   03/17/17 0806  BP: 130/81  Pulse: 63  Resp: 18  Temp: 36.5 C    Last Pain:  Vitals:   03/17/17 0818  TempSrc:   PainSc: 5       Patients Stated Pain Goal: 5 (03/49/61 1643)  Complications: No apparent anesthesia complications

## 2017-03-17 NOTE — Discharge Instructions (Signed)
Ok to shower in 5 days Walk as much as possible Keep dressing clean and dry

## 2017-03-17 NOTE — Anesthesia Preprocedure Evaluation (Addendum)
Anesthesia Evaluation  Patient identified by MRN, date of birth, ID band Patient awake    Reviewed: Allergy & Precautions, NPO status , Patient's Chart, lab work & pertinent test results  Airway Mallampati: II  TM Distance: >3 FB Neck ROM: Limited    Dental no notable dental hx. (+) Teeth Intact, Dental Advisory Given   Pulmonary neg pulmonary ROS,    Pulmonary exam normal breath sounds clear to auscultation       Cardiovascular hypertension, Pt. on medications Normal cardiovascular exam Rhythm:Regular Rate:Normal  LBBB   Neuro/Psych negative neurological ROS  negative psych ROS   GI/Hepatic negative GI ROS, Neg liver ROS,   Endo/Other  Hypothyroidism   Renal/GU negative Renal ROS  negative genitourinary   Musculoskeletal negative musculoskeletal ROS (+)   Abdominal   Peds negative pediatric ROS (+)  Hematology negative hematology ROS (+)   Anesthesia Other Findings   Reproductive/Obstetrics negative OB ROS                           Anesthesia Physical Anesthesia Plan  ASA: II  Anesthesia Plan: General   Post-op Pain Management:    Induction: Intravenous  PONV Risk Score and Plan: 2 and Ondansetron and Dexamethasone  Airway Management Planned: Oral ETT  Additional Equipment:   Intra-op Plan:   Post-operative Plan: Extubation in OR  Informed Consent: I have reviewed the patients History and Physical, chart, labs and discussed the procedure including the risks, benefits and alternatives for the proposed anesthesia with the patient or authorized representative who has indicated his/her understanding and acceptance.   Dental advisory given  Plan Discussed with: CRNA  Anesthesia Plan Comments:         Anesthesia Quick Evaluation

## 2017-03-17 NOTE — Anesthesia Postprocedure Evaluation (Signed)
Anesthesia Post Note  Patient: Sharon Gordon  Procedure(s) Performed: Procedure(s) (LRB): LUMBAR SPINAL CORD STIMULATOR INSERTION (N/A)     Patient location during evaluation: PACU Anesthesia Type: General Level of consciousness: awake and alert Pain management: pain level controlled Vital Signs Assessment: post-procedure vital signs reviewed and stable Respiratory status: spontaneous breathing, nonlabored ventilation, respiratory function stable and patient connected to nasal cannula oxygen Cardiovascular status: blood pressure returned to baseline and stable Postop Assessment: no signs of nausea or vomiting Anesthetic complications: no    Last Vitals:  Vitals:   03/17/17 1255 03/17/17 1310  BP: 129/74 (!) 115/57  Pulse: 75 71  Resp: 20 (!) 21  Temp:      Last Pain:  Vitals:   03/17/17 1230  TempSrc:   PainSc: Asleep                 [name]

## 2017-03-17 NOTE — Care Management CC44 (Deleted)
Condition Code 44 Documentation Completed  Patient Details  Name: NYJA WESTBROOK MRN: [id number] Date of Birth: 11-19-1927   Condition Code 44 given:  Yes Patient signature on Condition Code 44 notice:  Yes Documentation of 2 MD's agreement:  Yes Code 44 added to claim:  Yes    Ninfa Meeker, RN 03/17/2017, 3:22 PM

## 2017-03-17 NOTE — Anesthesia Procedure Notes (Signed)
Procedure Name: Intubation Date/Time: 03/17/2017 10:12 AM Performed by: Jenne Campus Pre-anesthesia Checklist: Patient identified, Emergency Drugs available, Suction available and Patient being monitored Patient Re-evaluated:Patient Re-evaluated prior to inductionOxygen Delivery Method: Circle System Utilized Preoxygenation: Pre-oxygenation with 100% oxygen Intubation Type: IV induction Ventilation: Mask ventilation without difficulty Laryngoscope Size: Miller and 2 Grade View: Grade II Tube type: Oral Tube size: 7.0 mm Number of attempts: 1 Airway Equipment and Method: Stylet and Oral airway Placement Confirmation: ETT inserted through vocal cords under direct vision,  positive ETCO2 and breath sounds checked- equal and bilateral Secured at: 21 cm Tube secured with: Tape Dental Injury: Teeth and Oropharynx as per pre-operative assessment

## 2017-03-17 NOTE — Op Note (Signed)
Preoperative diagnosis chronic pain syndrome.  Failed back syndrome. Status post lumbar spinal fusion.  First assistant: Ronette Deter, [place].  Procedure implantation spinal cord stimulator.  Implant used tri-pole spinal cord stimulator from [employer]. Nonrechargeable battery.  Complications none  History this is a very pleasant 83 year old woman who's had a previous lumbar spinal fusion and unfortunately has persistent back and buttock and neuropathic left leg pain. Attempts at conservative management had failed to alleviate her symptoms she ultimately had a spinal cord stimulator trial which successfully relieved her pain. As a result for definitive implantation. After discussing all risks benefits and alternatives to surgery as well as the surgical procedure itself the patient consented to the procedure.  Operative note patient was brought to the operating room placed supine the operating table. After successful induction of general anesthesia and endotracheal intubation teds and SCDs were applied and she was turned prone onto the Wilson frame. All bony prominences were well-padded and the back was prepped and draped in a standard fashion.  Timeout was taken confirming patient procedure and all other pertinent port data.  Fluoroscopy was then brought into the field to identify the T10-11 disc space. Once this was identified and marked out incision site and infiltrated with quarter percent Marcaine with epinephrine. The battery site had already been marked out in the preop holding area. This incision site was also infiltrated with quarter percent Marcaine with epinephrine.  The thoracic incision was made with a 10 blade scalpel and sharp dissection was carried out down to the deep fascia. The deep fascia was then sharply incised and the  the paraspinal muscles were stripped using a Bovie and Cobb to expose the T10-T11 and a portion of the T12 spinous process and lamina. Self-retaining retractor was  then placed into the wound and fluoroscopy was brought back into the surgical site. X-ray confirmed the T10 lamina. The spinous process of T10 was then excised using a double action Leksell rongeur. 2 and 3 mm Kerrison punch was then used to perform my laminotomy of T10. Penfield 4 was used to dissect through the central raphae of the ligamentum flavum and then I used my 2 mm Kerrison punch to remove the ligamentum flavum. At this point I could now visualize the dorsal aspect of the thecal sac.  The dural spatula was then gently advanced under T10 laminotomy site and advanced superiorly until it reached the T8-9 disc space. X-ray confirmed midline positioning of the spatula. I then removed this and then obtained the dorsal column stimulator paddle. I then advanced the paddle to the appropriate position behind the T9 vertebral body this was done without any significant tension or resistance. X-rays confirmed midline position at the from the inferior aspect of T8 across T8-9 disc space and across the T9 vertebral body. I then confirmed with the spinal cord stimulator rep that the paddle was properly positioned, and would cover the same area as the trial. Once this was confirmed I then secured the leads directly to the spinous process of T 11 using an Ethibond #2 suture.  The incision site was then incised sharply dissected down approximately 3-1/2 cm. I then created a pocket for the battery. Once this was completed I then  passed the leads from the thoracic wound to the gluteal wound using the submuscular passing device. The leads were then connected to the battery and tested. Both leads were functioning according to the rep. The battery was then placed into the pocket and secured to the deep  fascia with 2 #1 Vicryl sutures. At this point the stimulator was properly positioned as was the battery.  Final intraoperative x-rays were taken confirming satisfactory position of the paddle in both the AP and lateral  planes. Both wounds were then copiously irrigated with normal saline and closed in a layered fashion with interrupted #1 Vicryl suture, 2-0 Vicryl suture, and 3-0 Monocryl. Steri-Strips dry dressings were applied and the patient was ultimately extubated and transferred to the PACU without incident. The end of the case all needle sponge counts were correct. No adverse intraoperative events.

## 2017-03-18 ENCOUNTER — Encounter (HOSPITAL_COMMUNITY): Payer: Self-pay | Admitting: Orthopedic Surgery

## 2017-03-18 DIAGNOSIS — G894 Chronic pain syndrome: Secondary | ICD-10-CM | POA: Diagnosis not present

## 2017-03-18 NOTE — Evaluation (Signed)
Occupational Therapy Evaluation Patient Details Name: Sharon Gordon MRN: [id number] DOB: [birth year] Today's Date: 03/18/2017    History of Present Illness Pt is an 83 y/o female s/p lumbar spinal cord stimulator insertion. PMH including but not limited to hx of bilateral TKA.   Clinical Impression   Patient evaluated by Occupational Therapy with no further acute OT needs identified. All education has been completed and the patient has no further questions. See below for any follow-up Occupational Therapy or equipment needs. OT to sign off. Thank you for referral.      Follow Up Recommendations  No OT follow up    Equipment Recommendations  None recommended by OT    Recommendations for Other Services       Precautions / Restrictions Precautions Precautions: Back Precaution Booklet Issued: Yes (comment) Precaution Comments: handout provided and reviewed for adls Restrictions Weight Bearing Restrictions: No      Mobility Bed Mobility Overal bed mobility: Modified Independent                Transfers Overall transfer level: Modified independent Equipment used: Straight cane                  Balance Overall balance assessment: Needs assistance Sitting-balance support: Feet supported Sitting balance-Leahy Scale: Good     Standing balance support: During functional activity;No upper extremity supported Standing balance-Leahy Scale: Fair                             ADL either performed or assessed with clinical judgement   ADL Overall ADL's : Modified independent                                       General ADL Comments: able to cross bil LE and touch feet for dressing. pt able to demonstrate tub shower transfer. pt has 68 yo spouse in the house that can help. Daughter will give 6 /7 initially. pt advised to use paper plates nad cups for two weeks to allow healing and less chores.   Back handout provided and reviewed adls  in detail. Pt educated on: , set an alarm at night for medication, avoid sitting for long periods of time, correct bed positioning for sleeping, correct sequence for bed mobility, avoiding lifting more than 5 pounds and never wash directly over incision. All education is complete and patient indicates understanding. Pt educated on bathing and avoid washing directly on incision. Pt educated to use new wash cloth and towel each day. Pt educated to allow water to run across dressing and not to soak in a tub at this time. Pt advised RN will instruct on any bandages required otherwise is open to air.       Vision Baseline Vision/History: Wears glasses Wears Glasses: At all times       Perception     Praxis      Pertinent Vitals/Pain Pain Assessment: Faces Faces Pain Scale: Hurts a little bit Pain Location: incision site Pain Descriptors / Indicators: Sore     Hand Dominance     Extremity/Trunk Assessment Upper Extremity Assessment Upper Extremity Assessment: Overall WFL for tasks assessed   Lower Extremity Assessment Lower Extremity Assessment: Defer to PT evaluation   Cervical / Trunk Assessment Cervical / Trunk Assessment: Kyphotic   Communication Communication Communication: No difficulties   Cognition Arousal/Alertness: Awake/alert  Behavior During Therapy: WFL for tasks assessed/performed Overall Cognitive Status: Within Functional Limits for tasks assessed                                     General Comments       Exercises     Shoulder Instructions      Home Living Family/patient expects to be discharged to:: Private residence Living Arrangements: Spouse/significant other Available Help at Discharge: Family;Available 24 hours/day (daughter will be staying with pt for one week) Type of Home: House Home Access: Ramped entrance     Home Layout: Laundry or work area in [place] Shower/Tub: Teacher, early years/pre:  [place]: Environmental consultant - 2 wheels;Cane - single point;Bedside commode          Prior Functioning/Environment Level of Independence: Independent with assistive device(s)        Comments: pt reported that she would use a SPC to ambulate within her community        OT Problem List:        OT Treatment/Interventions:      OT Goals(Current goals can be found in the care plan section) Acute Rehab OT Goals Patient Stated Goal: return home and get back into walking regularly  OT Frequency:     Barriers to D/C:            Co-evaluation              AM-PAC PT "6 Clicks" Daily Activity     Outcome Measure Help from another person eating meals?: None Help from another person taking care of personal grooming?: None Help from another person toileting, which includes using toliet, bedpan, or urinal?: None Help from another person bathing (including washing, rinsing, drying)?: None Help from another person to put on and taking off regular upper body clothing?: None Help from another person to put on and taking off regular lower body clothing?: None 6 Click Score: 24   End of Session Nurse Communication: Mobility status;Precautions  Activity Tolerance: Patient tolerated treatment well Patient left: in chair;with call bell/phone within reach;with family/visitor present  OT Visit Diagnosis: Unsteadiness on feet (R26.81)                Time: 4174-0814 OT Time Calculation (min): 18 min Charges:  OT General Charges $OT Visit: 1 Procedure OT Evaluation $OT Eval Moderate Complexity: 1 Procedure G-Codes: OT G-codes **NOT FOR INPATIENT CLASS** Functional Assessment Tool Used: Clinical judgement Functional Limitation: Self care Self Care Current Status (G8185): 0 percent impaired, limited or restricted Self Care Goal Status (U3149): 0 percent impaired, limited or restricted Self Care Discharge Status (F0263): 0 percent impaired, limited or restricted    Jeri Modena   OTR/L Pager: [phone] Office: [phone] .   Parke Poisson B 03/18/2017, 3:11 PM

## 2017-03-18 NOTE — Progress Notes (Signed)
Pt doing well. Pt and daughter given D/C instructions with Rx's, verbal understanding was provided. Pt's incision is clean and dry with no sign of infection. Spine stimulator was turned on by Rep prior to D/C. Pt D/C'd home via wheelchair @ 0940 per MD order. Pt is stable @ D/C and has no other needs at this time. Holli Humbles, RN

## 2017-03-18 NOTE — Progress Notes (Signed)
    Subjective: Procedure(s) (LRB): LUMBAR SPINAL CORD STIMULATOR INSERTION (N/A) 1 Day Post-Op  Patient reports pain as 2 on 0-10 scale.  Reports decreased leg pain reports incisional back pain   Positive void Positive bowel movement Positive flatus Negative chest pain or shortness of breath  Objective: Vital signs in last 24 hours: Temp:  [97.4 F (36.3 C)-98.1 F (36.7 C)] 97.8 F (36.6 C) (06/15 0806) Pulse Rate:  [71-89] 89 (06/15 0806) Resp:  [9-23] 18 (06/15 0806) BP: (100-143)/(47-79) 125/68 (06/15 0806) SpO2:  [89 %-100 %] 94 % (06/15 0417)  Intake/Output from previous day: 06/14 0701 - 06/15 0700 In: 1350 [I.V.:1250; IV Piggyback:100] Out: 50 [Blood:50]  Labs:  Recent Labs  03/15/17 1313  WBC 6.4  RBC 3.94  HCT 39.0  PLT 331    Recent Labs  03/15/17 1313  NA 138  K 4.0  CL 102  CO2 28  BUN 31*  CREATININE 1.20*  GLUCOSE 95  CALCIUM 9.4   No results for input(s): LABPT, INR in the last 72 hours.  Physical Exam: Neurologically intact ABD soft Intact pulses distally Incision: dressing C/D/I Compartment soft  Assessment/Plan: Patient stable  xrays n/a Continue mobilization with physical therapy Continue care  Advance diet Up with therapy  Plan on d/c to home today  Melina Schools, MD [place] [phone]

## 2017-04-12 NOTE — Discharge Summary (Signed)
Physician Discharge Summary  Patient ID: Sharon Gordon MRN: [id number] DOB/AGE: 81/29/1929 81 y.o.  Admit date: 03/17/2017 Discharge date: 03/18/17  Admission Diagnoses:  Chronic Pain Syndrome  Discharge Diagnoses:  Active Problems:   S/P insertion of spinal cord stimulator   Back pain   Past Medical History:  Diagnosis Date  . Anxiety   . Arthritis   . Cancer ([place])    skin cancer on nose removed   . Hypertension   . Hypothyroidism   . Lateral meniscal tear    right  . Vein symptom    patients reports vein behind left leg at knee hurting at times for last week    Surgeries: Procedure(s): LUMBAR SPINAL CORD STIMULATOR INSERTION on 03/17/2017   Consultants (if any):   Discharged Condition: Improved  Hospital Course: Sharon Gordon is an 83 y.o. female who was admitted 03/17/2017 with a diagnosis of Chronic pain syndrome and went to the operating room on 03/17/2017 and underwent the above named procedures.  Post op day 1 pt reports a low level of pain controlled on oral medication. Pt has been ambulating in hall.  Pt cleared by PT for DC.  Pt is urinating w/o difficulty.  Pt met with [facility] rep before DC.   She was given perioperative antibiotics:  Anti-infectives    Start     Dose/Rate Route Frequency Ordered Stop   03/17/17 1800  ceFAZolin (ANCEF) IVPB 2g/100 mL premix     2 g 200 mL/hr over 30 Minutes Intravenous Every 8 hours 03/17/17 1341 03/18/17 0230   03/17/17 1430  ceFAZolin (ANCEF) IVPB 2g/100 mL premix     2 g 200 mL/hr over 30 Minutes Intravenous To ShortStay Surgical 03/16/17 0753 03/17/17 1013    .  She was given sequential compression devices, early ambulation, and TED for DVT prophylaxis.  She benefited maximally from the hospital stay and there were no complications.    Recent vital signs:  Vitals:   03/18/17 0417 03/18/17 0806  BP: (!) 104/53 125/68  Pulse: 74 89  Resp: 16 18  Temp: 97.7 F (36.5 C) 97.8 F (36.6 C)    Recent laboratory  studies:  Lab Results  Component Value Date   HGB 12.6 03/15/2017   HGB 8.2 (L) 05/19/2016   HGB 9.2 (L) 05/18/2016   Lab Results  Component Value Date   WBC 6.4 03/15/2017   PLT 331 03/15/2017   Lab Results  Component Value Date   INR 0.97 05/10/2016   Lab Results  Component Value Date   NA 138 03/15/2017   K 4.0 03/15/2017   CL 102 03/15/2017   CO2 28 03/15/2017   BUN 31 (H) 03/15/2017   CREATININE 1.20 (H) 03/15/2017   GLUCOSE 95 03/15/2017    Discharge Medications:   Allergies as of 03/18/2017      Reactions   No Known Allergies       Medication List    STOP taking these medications   diclofenac 75 MG EC tablet Commonly known as:  VOLTAREN   ibuprofen 200 MG tablet Commonly known as:  ADVIL,MOTRIN   oxyCODONE-acetaminophen 5-325 MG tablet Commonly known as:  PERCOCET/ROXICET Replaced by:  oxyCODONE-acetaminophen 10-325 MG tablet   TURMERIC CURCUMIN PO     TAKE these medications   acetaminophen 325 MG tablet Commonly known as:  TYLENOL Take 325 mg by mouth every 6 (six) hours as needed for moderate pain.   amLODipine 5 MG tablet Commonly known as:  NORVASC Take 5  mg by mouth every evening.   cholecalciferol 1000 units tablet Commonly known as:  VITAMIN D Take 1,000 Units by mouth daily. (25 MCG)   DULoxetine 30 MG capsule Commonly known as:  CYMBALTA Take 30 mg by mouth every morning.   Fish Oil 1000 MG Caps Take 1,000 mg by mouth daily.   gabapentin 300 MG capsule Commonly known as:  NEURONTIN Take 600 mg by mouth 3 (three) times daily.   levothyroxine 50 MCG tablet Commonly known as:  SYNTHROID, LEVOTHROID Take 50 mcg by mouth daily before breakfast.   lisinopril-hydrochlorothiazide 20-12.5 MG tablet Commonly known as:  PRINZIDE,ZESTORETIC Take 1 tablet by mouth daily.   loratadine 10 MG tablet Commonly known as:  CLARITIN Take 10 mg by mouth daily.   LORazepam 0.5 MG tablet Commonly known as:  ATIVAN Take 0.5 mg by mouth 3  (three) times daily as needed for anxiety.   multivitamin with minerals Tabs tablet Take 1 tablet by mouth daily.   ondansetron 4 MG tablet Commonly known as:  ZOFRAN Take 1 tablet (4 mg total) by mouth every 8 (eight) hours as needed for nausea or vomiting.   oxyCODONE-acetaminophen 10-325 MG tablet Commonly known as:  PERCOCET Take 1 tablet by mouth every 4 (four) hours as needed for pain. Replaces:  oxyCODONE-acetaminophen 5-325 MG tablet   tolterodine 4 MG 24 hr capsule Commonly known as:  DETROL LA Take 4 mg by mouth daily.   traZODone 50 MG tablet Commonly known as:  DESYREL Take 50 mg by mouth at bedtime.   vitamin C 1000 MG tablet Take 1,000 mg by mouth daily.   vitamin E 400 UNIT capsule Take 400 Units by mouth daily.   zinc gluconate 50 MG tablet Take 50 mg by mouth daily.       Diagnostic Studies: Dg Thoracolumabar Spine  Result Date: 03/17/2017 CLINICAL DATA:  Spinal stimulator placement EXAM: THORACOLUMBAR SPINE - 2 VIEW; DG C-ARM 61-120 MIN COMPARISON:  None. FLUOROSCOPY TIME:  Fluoroscopy Time:  22 seconds Radiation Exposure Index (if provided by the fluoroscopic device): Not available Number of Acquired Spot Images: 2 FINDINGS: Spinal stimulator is noted over the lower thoracic spine. No acute bony abnormality is noted. IMPRESSION: Spinal stimulator placement Electronically Signed   By: Inez Catalina M.D.   On: 03/17/2017 11:37   Dg C-arm 1-60 Min  Result Date: 03/17/2017 CLINICAL DATA:  Spinal stimulator placement EXAM: THORACOLUMBAR SPINE - 2 VIEW; DG C-ARM 61-120 MIN COMPARISON:  None. FLUOROSCOPY TIME:  Fluoroscopy Time:  22 seconds Radiation Exposure Index (if provided by the fluoroscopic device): Not available Number of Acquired Spot Images: 2 FINDINGS: Spinal stimulator is noted over the lower thoracic spine. No acute bony abnormality is noted. IMPRESSION: Spinal stimulator placement Electronically Signed   By: Inez Catalina M.D.   On: 03/17/2017 11:37     Disposition: 01-Home or Self Care   Pt will present to clinic in 2 weeks Post op medications provided  Discharge Instructions    Incentive spirometry RT    Complete by:  As directed       Follow-up Information    Melina Schools, MD. Schedule an appointment as soon as possible for a visit in 2 weeks.   Specialty:  Orthopedic Surgery Why:  If symptoms worsen, For wound re-check, For suture removal Contact information: [address] [place] [address] [phone]            Signed: Valinda Hoar 04/12/2017, 11:53 AM

## 2019-11-07 ENCOUNTER — Other Ambulatory Visit: Payer: Self-pay | Admitting: Chiropractic Medicine

## 2019-11-07 DIAGNOSIS — M5416 Radiculopathy, lumbar region: Secondary | ICD-10-CM

## 2019-11-09 ENCOUNTER — Other Ambulatory Visit: Payer: Medicare Other

## 2019-11-15 ENCOUNTER — Ambulatory Visit
Admission: RE | Admit: 2019-11-15 | Discharge: 2019-11-15 | Disposition: A | Discharge status: Home / Self Care | Payer: Medicare Other | Source: Ambulatory Visit | Attending: Chiropractic Medicine | Admitting: Chiropractic Medicine

## 2019-11-15 ENCOUNTER — Other Ambulatory Visit: Payer: Self-pay

## 2019-11-15 DIAGNOSIS — M5416 Radiculopathy, lumbar region: Secondary | ICD-10-CM

## 2019-12-13 ENCOUNTER — Other Ambulatory Visit (HOSPITAL_COMMUNITY): Payer: Self-pay | Admitting: Physical Medicine and Rehabilitation

## 2019-12-13 ENCOUNTER — Other Ambulatory Visit: Payer: Self-pay | Admitting: Physical Medicine and Rehabilitation

## 2019-12-13 DIAGNOSIS — M545 Low back pain, unspecified: Secondary | ICD-10-CM

## 2019-12-13 DIAGNOSIS — M5136 Other intervertebral disc degeneration, lumbar region: Secondary | ICD-10-CM

## 2019-12-19 ENCOUNTER — Encounter (HOSPITAL_COMMUNITY)
Admission: RE | Admit: 2019-12-19 | Discharge: 2019-12-19 | Disposition: A | Discharge status: Home / Self Care | Payer: Medicare Other | Source: Ambulatory Visit | Attending: Physical Medicine and Rehabilitation | Admitting: Physical Medicine and Rehabilitation

## 2019-12-19 ENCOUNTER — Other Ambulatory Visit: Payer: Self-pay

## 2019-12-19 ENCOUNTER — Other Ambulatory Visit (HOSPITAL_COMMUNITY): Payer: Self-pay

## 2019-12-19 DIAGNOSIS — M545 Low back pain, unspecified: Secondary | ICD-10-CM

## 2019-12-19 DIAGNOSIS — M5136 Other intervertebral disc degeneration, lumbar region: Secondary | ICD-10-CM | POA: Insufficient documentation

## 2019-12-19 MED ORDER — TECHNETIUM TC 99M MEDRONATE IV KIT
20.0000 | PACK | Freq: Once | INTRAVENOUS | Status: AC | PRN
  Administered 2019-12-19: 22 via INTRAVENOUS

## 2020-03-31 ENCOUNTER — Encounter: Payer: Self-pay | Admitting: Physical Therapy

## 2020-03-31 ENCOUNTER — Other Ambulatory Visit: Payer: Self-pay

## 2020-03-31 ENCOUNTER — Ambulatory Visit: Payer: Medicare Other | Attending: Rehabilitative and Restorative Service Providers" | Admitting: Physical Therapy

## 2020-03-31 DIAGNOSIS — M25552 Pain in left hip: Secondary | ICD-10-CM | POA: Insufficient documentation

## 2020-03-31 DIAGNOSIS — M6281 Muscle weakness (generalized): Secondary | ICD-10-CM | POA: Diagnosis present

## 2020-03-31 NOTE — Therapy (Signed)
[facility] [place], [place], [address] Phone: [phone]   Fax:  [phone]  Physical Therapy Evaluation  Patient Details  Name: Sharon Gordon MRN: [id number] Date of Birth: June 19, 1928 Referring Provider (PT): Corena Pilgrim. Wooten PA-C.   Encounter Date: 03/31/2020   PT End of Session - 03/31/20 1110    Visit Number 1    Number of Visits 12    Date for PT Re-Evaluation 06/29/20    PT Start Time 1030    PT Stop Time 1115    PT Time Calculation (min) 45 min    Behavior During Therapy [facility] for tasks assessed/performed           Past Medical History:  Diagnosis Date  . Anxiety   . Arthritis   . Cancer ([place])    skin cancer on nose removed   . Hypertension   . Hypothyroidism   . Lateral meniscal tear    right  . Vein symptom    patients reports vein behind left leg at knee hurting at times for last week    Past Surgical History:  Procedure Laterality Date  . ABDOMINAL HYSTERECTOMY  1980's   complete  . BACK SURGERY  2010  . bladder tach  1980's  . colonscopy    . KNEE ARTHROSCOPY Right 03/12/2015   Procedure: ARTHROSCOPY RIGHT KNEE WITH LATERAL MENISCAL DEBRIDEMENT;  Surgeon: Gaynelle Arabian, MD;  Location: [facility];  Service: Orthopedics;  Laterality: Right;  . skin cancer on nose removal     . SPINAL CORD STIMULATOR INSERTION N/A 03/17/2017   Procedure: LUMBAR SPINAL CORD STIMULATOR INSERTION;  Surgeon: Melina Schools, MD;  Location: [facility];  Service: Orthopedics;  Laterality: N/A;  2 hrs  . TOTAL KNEE ARTHROPLASTY Right 07/14/2015   Procedure: TOTAL RIGHT KNEE ARTHROPLASTY;  Surgeon: Gaynelle Arabian, MD;  Location: [facility];  Service: Orthopedics;  Laterality: Right;  . TOTAL KNEE ARTHROPLASTY Left 05/17/2016   Procedure: LEFT TOTAL KNEE ARTHROPLASTY;  Surgeon: Gaynelle Arabian, MD;  Location: [facility];  Service: Orthopedics;  Laterality: Left;    There were no vitals filed for this visit.    Subjective Assessment -  03/31/20 1101    Subjective COVID-19 screen performed prior to patient entering clinic.  The patient presents to the clinic today with a CC of left hip pain that has gotten gradually worse over the last 6 months.  She has a h/o low back pain but states she has had good pain relief since a lumbar stimulator was put in place.  She also reports "three nerve blocks."  She also received an injection in her SIJ's.  She has also tried Chiropractic care but that too was not helpful.  She reports severe pain in her left hip today.  She has to do housework in short durations due to pain.  Standing increases her pain.  Sitting decreases her pain.    Pertinent History Lumbar surgery, bilateral TKA's,    How long can you stand comfortably? Varies.    How long can you walk comfortably? Short distances with cane.    Patient Stated Goals Get rid of left hip pain.    Currently in Pain? Yes    Pain Score 10-Worst pain ever    Pain Location Hip    Pain Orientation Left    Pain Descriptors / Indicators Aching;Sharp;Throbbing;Shooting    Pain Type Chronic pain    Pain Onset More than a month ago    Pain Frequency Constant  Aggravating Factors  See above.    Pain Relieving Factors See above.              [facility] PT Assessment - 03/31/20 0001      Assessment   Medical Diagnosis Trochanteric bursitis.    Referring Provider (PT) Corena Pilgrim. Wooten PA-C.    Onset Date/Surgical Date --   6 months+.     Precautions   Precautions None      Restrictions   Weight Bearing Restrictions No      Balance Screen   Has the patient fallen in the past 6 months No    Has the patient had a decrease in activity level because of a fear of falling?  No    Is the patient reluctant to leave their home because of a fear of falling?  No      Prior Function   Level of Independence Independent      Posture/Postural Control   Posture/Postural Control Postural limitations    Postural Limitations Rounded Shoulders;Forward  head;Weight shift right      ROM / Strength   AROM / PROM / Strength AROM;Strength      AROM   Overall AROM Comments Normal left hip range of motion.      Strength   Overall Strength Comments Right hip flexion= 3 to 3+/5 and abduction= 2+ to 3-/5.        Palpation   Palpation comment Very tender over and around the patient's left greater trochanter.  Very taut to palpation over left glut med and TFL.  No tenderness over left SIJ.      Special Tests   Other special tests Left LE slight shorter than right.      Ambulation/Gait   Gait Comments Trendelenburg gait pattern with patient using a straight cane.                      Objective measurements completed on examination: See above findings.       [facility] Adult PT Treatment/Exercise - 03/31/20 0001      Modalities   Modalities Electrical Stimulation      Electrical Stimulation   Electrical Stimulation Location Left lateral hip.    Electrical Stimulation Action Pre-mod.    Electrical Stimulation Parameters 80-150 Hz x 20 minutes.    Electrical Stimulation Goals Pain                       PT Long Term Goals - 03/31/20 1217      PT LONG TERM GOAL #1   Title Independent with a HEP.    Time 4    Period Weeks    Status New      PT LONG TERM GOAL #2   Title Stand 20 minutes with left hip pain not > 3-4/10.    Time 4    Period Weeks    Status New      PT LONG TERM GOAL #3   Title Perform ADL's with pain not > 4/10.    Time 4    Period Weeks    Status New                  Plan - 03/31/20 1209    Clinical Impression Statement The patient presents to OPPT with c/o left hip pain.  She is very weak.  She has a Trendelburg gait pattern.  She uses a staright cane for safety.  She is very  palpably tender over and around her left hip.  Her functional mobility is impaired.  Patient will benefit from skilled physical therapy intervention to address deficits and pain.    Personal Factors and  Comorbidities Comorbidity 1;Comorbidity 2    Comorbidities Lumbar surgery, bilateral TKA's, possible hernia.    Examination-Activity Limitations Locomotion Level;Stand    Examination-Participation Restrictions Other    Stability/Clinical Decision Making Evolving/Moderate complexity    Clinical Decision Making Low    Rehab Potential Good    PT Frequency 3x / week    PT Duration 4 weeks    PT Treatment/Interventions ADLs/Self Care Home Management;Cryotherapy;Electrical Stimulation;Ultrasound;Moist Heat;Therapeutic activities;Therapeutic exercise;Manual techniques;Patient/family education;Passive range of motion;Dry needling;Joint Manipulations           Patient will benefit from skilled therapeutic intervention in order to improve the following deficits and impairments:  Pain, Decreased activity tolerance, Abnormal gait, Decreased strength  Visit Diagnosis: Pain in left hip - Plan: PT plan of care cert/re-cert  Muscle weakness (generalized) - Plan: PT plan of care cert/re-cert     Problem List Patient Active Problem List   Diagnosis Date Noted  . S/P insertion of spinal cord stimulator 03/17/2017  . Back pain 03/17/2017  . OA (osteoarthritis) of knee 07/14/2015  . Lateral meniscal tear 03/12/2015    Deirdra Heumann, [place]  MPT 03/31/2020, 12:24 PM  [facility] [address] [place], [place], [address] Phone: [phone]   Fax:  [phone]  Name: BRYANAH SIDELL MRN: [id number] Date of Birth: 08-13-1928

## 2020-04-02 ENCOUNTER — Ambulatory Visit: Payer: Medicare Other | Admitting: Physical Therapy

## 2020-04-02 ENCOUNTER — Other Ambulatory Visit: Payer: Self-pay

## 2020-04-02 DIAGNOSIS — M25552 Pain in left hip: Secondary | ICD-10-CM | POA: Diagnosis not present

## 2020-04-02 DIAGNOSIS — M6281 Muscle weakness (generalized): Secondary | ICD-10-CM

## 2020-04-02 NOTE — Therapy (Signed)
[facility] [place], [place], [address] Phone: [phone]   Fax:  [phone]  Physical Therapy Treatment  Patient Details  Name: Sharon Gordon MRN: [id number] Date of Birth: 04/07/1928 Referring Provider (PT): Corena Pilgrim. Wooten PA-C.   Encounter Date: 04/02/2020   PT End of Session - 04/02/20 1112    Visit Number 2    Number of Visits 12    Date for PT Re-Evaluation 06/29/20    PT Start Time 1030    PT Stop Time 1126    PT Time Calculation (min) 56 min           Past Medical History:  Diagnosis Date  . Anxiety   . Arthritis   . Cancer ([place])    skin cancer on nose removed   . Hypertension   . Hypothyroidism   . Lateral meniscal tear    right  . Vein symptom    patients reports vein behind left leg at knee hurting at times for last week    Past Surgical History:  Procedure Laterality Date  . ABDOMINAL HYSTERECTOMY  1980's   complete  . BACK SURGERY  2010  . bladder tach  1980's  . colonscopy    . KNEE ARTHROSCOPY Right 03/12/2015   Procedure: ARTHROSCOPY RIGHT KNEE WITH LATERAL MENISCAL DEBRIDEMENT;  Surgeon: Gaynelle Arabian, MD;  Location: [facility];  Service: Orthopedics;  Laterality: Right;  . skin cancer on nose removal     . SPINAL CORD STIMULATOR INSERTION N/A 03/17/2017   Procedure: LUMBAR SPINAL CORD STIMULATOR INSERTION;  Surgeon: Melina Schools, MD;  Location: [facility];  Service: Orthopedics;  Laterality: N/A;  2 hrs  . TOTAL KNEE ARTHROPLASTY Right 07/14/2015   Procedure: TOTAL RIGHT KNEE ARTHROPLASTY;  Surgeon: Gaynelle Arabian, MD;  Location: [facility];  Service: Orthopedics;  Laterality: Right;  . TOTAL KNEE ARTHROPLASTY Left 05/17/2016   Procedure: LEFT TOTAL KNEE ARTHROPLASTY;  Surgeon: Gaynelle Arabian, MD;  Location: [facility];  Service: Orthopedics;  Laterality: Left;    There were no vitals filed for this visit.   Subjective Assessment - 04/02/20 1107    Subjective COVID-19 screen performed prior to  patient entering clinic. No new complaints.    Pertinent History Lumbar surgery, bilateral TKA's,    How long can you stand comfortably? Varies.    How long can you walk comfortably? Short distances with cane.    Patient Stated Goals Get rid of left hip pain.    Currently in Pain? Yes    Pain Score 9     Pain Location Hip    Pain Orientation Left    Pain Descriptors / Indicators Aching;Sharp;Throbbing;Shooting    Pain Onset More than a month ago                             [facility] Adult PT Treatment/Exercise - 04/02/20 0001      Modalities   Modalities Electrical Stimulation;Ultrasound      Electrical Stimulation   Electrical Stimulation Location Left lateral hip.    Electrical Stimulation Action Pre-mod.    Electrical Stimulation Parameters 80-150 Hz x 20 minutes.    Electrical Stimulation Goals Pain      Ultrasound   Ultrasound Location Left lateral hip.    Ultrasound Parameters Combo e'stim/U/S at 1.50 w/CM2 x 12 minutes.      Manual Therapy   Manual Therapy Soft tissue mobilization    Soft tissue mobilization  STW/M including IASTM x 11 minutes to reduce left hip muscle tone and pain.                       PT Long Term Goals - 03/31/20 1217      PT LONG TERM GOAL #1   Title Independent with a HEP.    Time 4    Period Weeks    Status New      PT LONG TERM GOAL #2   Title Stand 20 minutes with left hip pain not > 3-4/10.    Time 4    Period Weeks    Status New      PT LONG TERM GOAL #3   Title Perform ADL's with pain not > 4/10.    Time 4    Period Weeks    Status New                 Plan - 04/02/20 1112    Clinical Impression Statement The patient did well with treatment today.  Left lateral hip musculature remains taut to palpation.    Personal Factors and Comorbidities Comorbidity 1;Comorbidity 2    Comorbidities Lumbar surgery, bilateral TKA's, possible hernia.    Examination-Activity Limitations Locomotion  Level;Stand    Examination-Participation Restrictions Other    Stability/Clinical Decision Making Evolving/Moderate complexity    Rehab Potential Good    PT Frequency 3x / week    PT Duration 4 weeks    PT Treatment/Interventions ADLs/Self Care Home Management;Cryotherapy;Electrical Stimulation;Ultrasound;Moist Heat;Therapeutic activities;Therapeutic exercise;Manual techniques;Patient/family education;Passive range of motion;Dry needling;Joint Manipulations    Consulted and Agree with Plan of Care Patient           Patient will benefit from skilled therapeutic intervention in order to improve the following deficits and impairments:  Pain, Decreased activity tolerance, Abnormal gait, Decreased strength  Visit Diagnosis: Pain in left hip  Muscle weakness (generalized)     Problem List Patient Active Problem List   Diagnosis Date Noted  . S/P insertion of spinal cord stimulator 03/17/2017  . Back pain 03/17/2017  . OA (osteoarthritis) of knee 07/14/2015  . Lateral meniscal tear 03/12/2015    Kyna Blahnik, [place] MPT 04/02/2020, 11:38 AM  [facility] [address] [place], [place], [address] Phone: [phone]   Fax:  [phone]  Name: Sharon Gordon MRN: [id number] Date of Birth: [birth year]

## 2020-04-08 ENCOUNTER — Ambulatory Visit: Payer: Medicare Other | Admitting: Physical Therapy

## 2020-05-04 DEATH — deceased

## 2021-06-20 IMAGING — CT CT L SPINE W/O CM
1 of 6 series · 6 of 14 positions shown, 8 images · non-contrast
Comparison: Images from intraoperative spinal stimulator placement
in 5605. Thoracic spine MRI 02/09/2017.

CLINICAL DATA: 89-year-old female with low back pain radiating to
the left hip for 2 weeks. Prior surgery.

EXAM:
CT LUMBAR SPINE WITHOUT CONTRAST
TECHNIQUE: Multidetector CT imaging of the lumbar spine was performed without
intravenous contrast administration. Multiplanar CT image
reconstructions were also generated.

[Series 3: l spine soft (person_name) · axial · 0.31mm/px · z∈[-234,-78]mm · 6 of 74 slices shown, 8 images]
[im 11/74  soft-tissue]
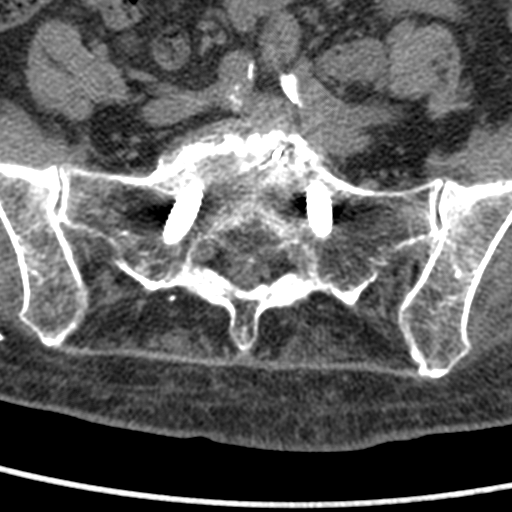
[im 11/74  bone]
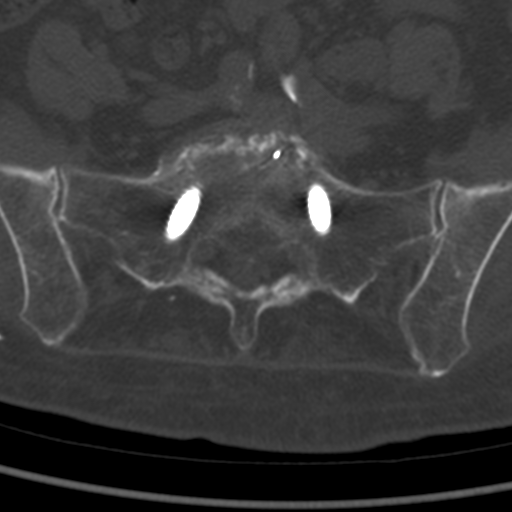
[im 21/74  bone]
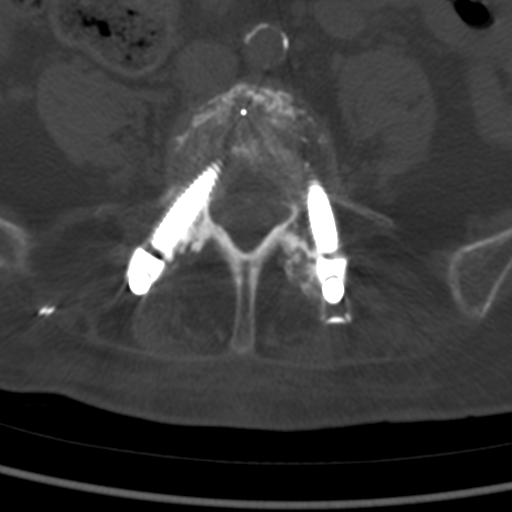
[im 32/74  bone]
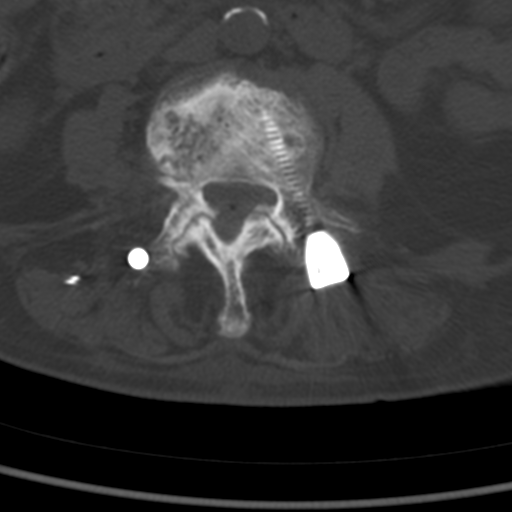
[im 42/74  bone]
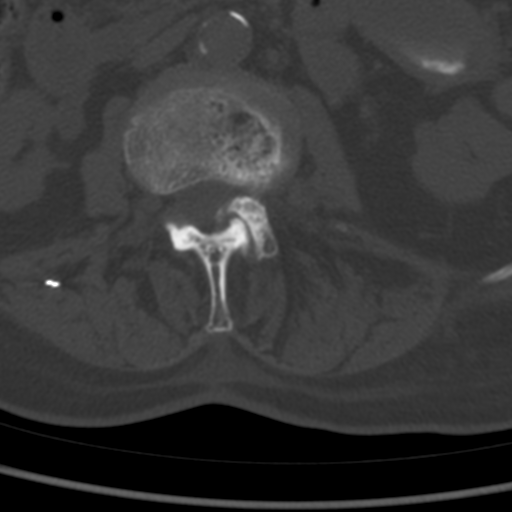
[im 53/74  soft-tissue]
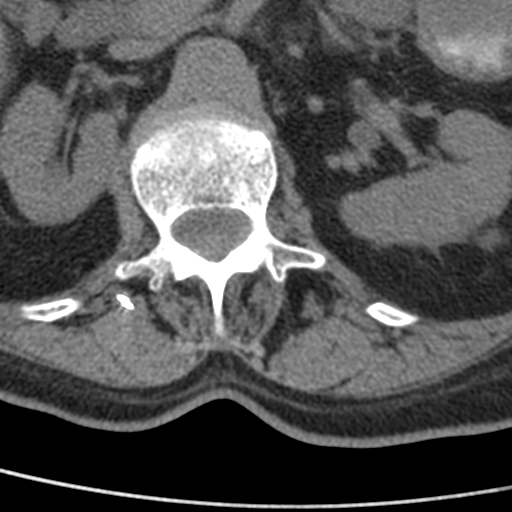
[im 53/74  bone]
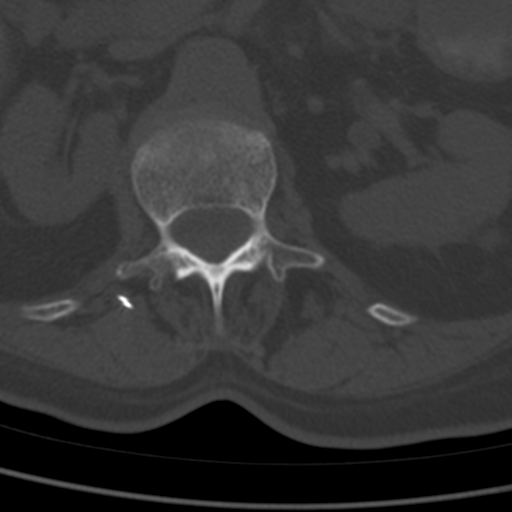
[im 63/74  bone]
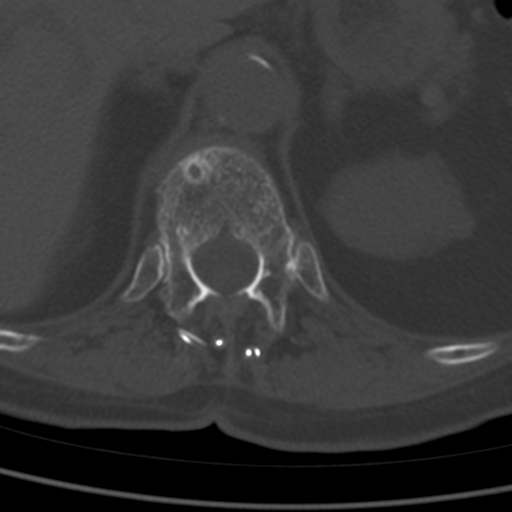

[6 of 14 positions shown; findings below may reference images not displayed]

FINDINGS: Segmentation: Normal.

Alignment: Dextroconvex upper and levoconvex lower lumbar scoliosis.
Grade 1 anterolisthesis of L3 on L4 measures 3-4 mm. Straightening
of lumbar lordosis elsewhere.

Vertebrae: Osteopenia. Postoperative details are below. The visible
lower thoracic levels appear intact. Intact visible sacrum and SI
joints. No acute osseous abnormality identified.

Paraspinal and other soft tissues: Partially visible thoracic spinal
stimulator leads which enter the dorsal spinal canal at T10-T11 and
then tracks through the right posterior paraspinal soft tissues
toward the flank.

Aortoiliac calcified atherosclerosis. Vascular patency is not
evaluated in the absence of IV contrast. Negative visible lung bases
and other abdominal viscera; there is some high density material in
bowel in the left abdomen. Postoperative changes to the posterior
paraspinal soft tissues with no adverse features identified.

Disc levels:

T12-L1: Disc space loss, vacuum disc and circumferential disc bulge.
Up to mild bilateral T12 foraminal stenosis greater on the left.

L1-L2: Better preserved disc space. Circumferential disc bulge
eccentric to the left. Mild posterior element hypertrophy primarily
on the left. Mild to moderate left lateral recess and left L1
foraminal stenosis (left L1 and L2 nerve levels).

L2-L3: Severe disc space loss with vacuum disc. Left eccentric
circumferential disc osteophyte complex with left greater than right
posterior element hypertrophy. Mild to moderate left lateral recess
and moderate left foraminal stenosis, left L2 and L3 nerve levels.

L3-L4: Anterolisthesis with severe disc space loss, vacuum disc and
subchondral sclerosis. Moderate to severe facet hypertrophy. Severe
spinal and lateral recess stenosis is evident on series 3, image 42.
Moderate bilateral L3 foraminal stenosis suspected.

L4-L5: Sequelae of posterior and interbody fusion. Transpedicular
screws appear intact and without evidence of loosening. Interbody
implant in place. Evidence of some interbody arthrodesis.
Solid-appearing posterior element arthrodesis a especially on the
right. No convincing stenosis.

L5-S1: Prior posterior and interbody fusion. Slightly medial course
of the right L5 screw on series 4, image 55. No evidence of hardware
loosening. Solid-appearing interbody and posterior element
arthrodesis. No convincing stenosis.
IMPRESSION: 1. Prior L4-L5 and L5-S1 fusion with solid-appearing arthrodesis.
2. Severe adjacent segment disease at L3-L4 with grade 1
spondylolisthesis, multifactorial severe spinal and lateral recess
stenosis, and moderate bilateral foraminal stenosis. Query left L3
and/or L4 radiculitis.
3. There is also left eccentric degenerative disease in the upper
lumbar spine at L1-L2 and L2-L3, with up to moderate left L1 through
L3 stenoses at those levels.
4. Dextroconvex upper and levoconvex lower lumbar scoliosis.
5. Aortic Atherosclerosis ([id number]).
